# Patient Record
Sex: Female | Born: 1955 | Race: Black or African American | Hispanic: No | Marital: Married | Smoking: Never smoker
Health system: Southern US, Community
[De-identification: ages and names within clinical notes are randomized; demographics above are authoritative.]

## PROBLEM LIST (undated history)

## (undated) DIAGNOSIS — E119 Type 2 diabetes mellitus without complications: Secondary | ICD-10-CM

---

## 2001-08-17 ENCOUNTER — Other Ambulatory Visit: Admission: RE | Admit: 2001-08-17 | Discharge: 2001-08-17 | Payer: Self-pay | Admitting: Obstetrics and Gynecology

## 2002-04-23 ENCOUNTER — Encounter: Payer: Self-pay | Admitting: Emergency Medicine

## 2002-04-23 ENCOUNTER — Emergency Department (HOSPITAL_COMMUNITY): Admission: EM | Admit: 2002-04-23 | Discharge: 2002-04-23 | Payer: Self-pay | Admitting: Emergency Medicine

## 2004-08-21 ENCOUNTER — Encounter: Admission: RE | Admit: 2004-08-21 | Discharge: 2004-08-21 | Payer: Self-pay | Admitting: Specialist

## 2005-04-29 ENCOUNTER — Emergency Department (HOSPITAL_COMMUNITY): Admission: EM | Admit: 2005-04-29 | Discharge: 2005-04-29 | Payer: Self-pay | Admitting: Emergency Medicine

## 2005-05-06 ENCOUNTER — Ambulatory Visit: Payer: Self-pay | Admitting: Internal Medicine

## 2005-05-10 ENCOUNTER — Ambulatory Visit: Payer: Self-pay | Admitting: Internal Medicine

## 2005-05-10 ENCOUNTER — Other Ambulatory Visit: Admission: RE | Admit: 2005-05-10 | Discharge: 2005-05-10 | Payer: Self-pay | Admitting: Internal Medicine

## 2005-05-10 ENCOUNTER — Encounter: Payer: Self-pay | Admitting: Internal Medicine

## 2005-06-08 ENCOUNTER — Ambulatory Visit: Payer: Self-pay | Admitting: Endocrinology

## 2005-12-02 ENCOUNTER — Ambulatory Visit: Payer: Self-pay | Admitting: Endocrinology

## 2005-12-03 ENCOUNTER — Ambulatory Visit: Payer: Self-pay | Admitting: Endocrinology

## 2006-04-06 ENCOUNTER — Ambulatory Visit: Payer: Self-pay | Admitting: Endocrinology

## 2006-04-06 LAB — CONVERTED CEMR LAB
ALT: 17 units/L (ref 0–40)
AST: 15 units/L (ref 0–37)
Albumin: 3.5 g/dL (ref 3.5–5.2)
Alkaline Phosphatase: 74 units/L (ref 39–117)
Bilirubin, Direct: 0.1 mg/dL (ref 0.0–0.3)
Chol/HDL Ratio, serum: 4.3
Cholesterol: 187 mg/dL (ref 0–200)
HDL: 43 mg/dL (ref >39.0)
LDL Cholesterol: 132 mg/dL — ABNORMAL HIGH (ref 0–99)
Total Bilirubin: 0.5 mg/dL (ref 0.3–1.2)
Total Protein: 6.9 g/dL (ref 6.0–8.3)
Triglyceride fasting, serum: 58 mg/dL (ref 0–149)
VLDL: 12 mg/dL (ref 0–40)

## 2006-04-07 ENCOUNTER — Ambulatory Visit: Payer: Self-pay | Admitting: Endocrinology

## 2006-05-10 ENCOUNTER — Ambulatory Visit: Payer: Self-pay | Admitting: Endocrinology

## 2006-05-10 LAB — CONVERTED CEMR LAB
ALT: 16 units/L (ref 0–40)
AST: 14 units/L (ref 0–37)
Albumin: 3.6 g/dL (ref 3.5–5.2)
Alkaline Phosphatase: 74 units/L (ref 39–117)
Bilirubin, Direct: 0.1 mg/dL (ref 0.0–0.3)
Chol/HDL Ratio, serum: 4.1
Cholesterol: 183 mg/dL (ref 0–200)
HDL: 45 mg/dL (ref >39.0)
Hgb A1c MFr Bld: 10.1 % — ABNORMAL HIGH (ref 4.6–6.0)
LDL Cholesterol: 118 mg/dL — ABNORMAL HIGH (ref 0–99)
Total Bilirubin: 0.6 mg/dL (ref 0.3–1.2)
Total Protein: 6.7 g/dL (ref 6.0–8.3)
Triglyceride fasting, serum: 98 mg/dL (ref 0–149)
VLDL: 20 mg/dL (ref 0–40)

## 2006-05-11 ENCOUNTER — Ambulatory Visit: Payer: Self-pay | Admitting: Endocrinology

## 2006-07-13 ENCOUNTER — Ambulatory Visit: Payer: Self-pay | Admitting: Endocrinology

## 2006-07-13 LAB — CONVERTED CEMR LAB
Cholesterol: 186 mg/dL (ref 0–200)
HDL: 40.6 mg/dL (ref >39.0)
Hgb A1c MFr Bld: 11.1 % — ABNORMAL HIGH (ref 4.6–6.0)
LDL Cholesterol: 124 mg/dL — ABNORMAL HIGH (ref 0–99)
Total CHOL/HDL Ratio: 4.6
Triglycerides: 107 mg/dL (ref 0–149)
VLDL: 21 mg/dL (ref 0–40)

## 2006-08-06 ENCOUNTER — Ambulatory Visit: Payer: Self-pay | Admitting: Internal Medicine

## 2006-08-10 ENCOUNTER — Ambulatory Visit: Payer: Self-pay | Admitting: Endocrinology

## 2007-01-02 ENCOUNTER — Encounter: Payer: Self-pay | Admitting: Endocrinology

## 2007-01-02 DIAGNOSIS — E119 Type 2 diabetes mellitus without complications: Secondary | ICD-10-CM | POA: Insufficient documentation

## 2007-02-19 ENCOUNTER — Emergency Department (HOSPITAL_COMMUNITY): Admission: EM | Admit: 2007-02-19 | Discharge: 2007-02-20 | Payer: Self-pay | Admitting: Emergency Medicine

## 2008-01-28 ENCOUNTER — Emergency Department (HOSPITAL_COMMUNITY): Admission: EM | Admit: 2008-01-28 | Discharge: 2008-01-28 | Payer: Self-pay | Admitting: Emergency Medicine

## 2008-10-09 ENCOUNTER — Emergency Department (HOSPITAL_COMMUNITY): Admission: EM | Admit: 2008-10-09 | Discharge: 2008-10-09 | Payer: Self-pay | Admitting: Internal Medicine

## 2008-10-21 ENCOUNTER — Ambulatory Visit (HOSPITAL_COMMUNITY): Admission: RE | Admit: 2008-10-21 | Discharge: 2008-10-21 | Payer: Self-pay | Admitting: Diagnostic Radiology

## 2009-10-11 IMAGING — CR DG CHEST 2V
2 series · 2 of 2 positions shown · non-contrast
Comparison: 02/20/2007

CLINICAL DATA: Status post fall.  Bruising of the right lateral
chest.

CHEST - 2 VIEW

[w chest pa]
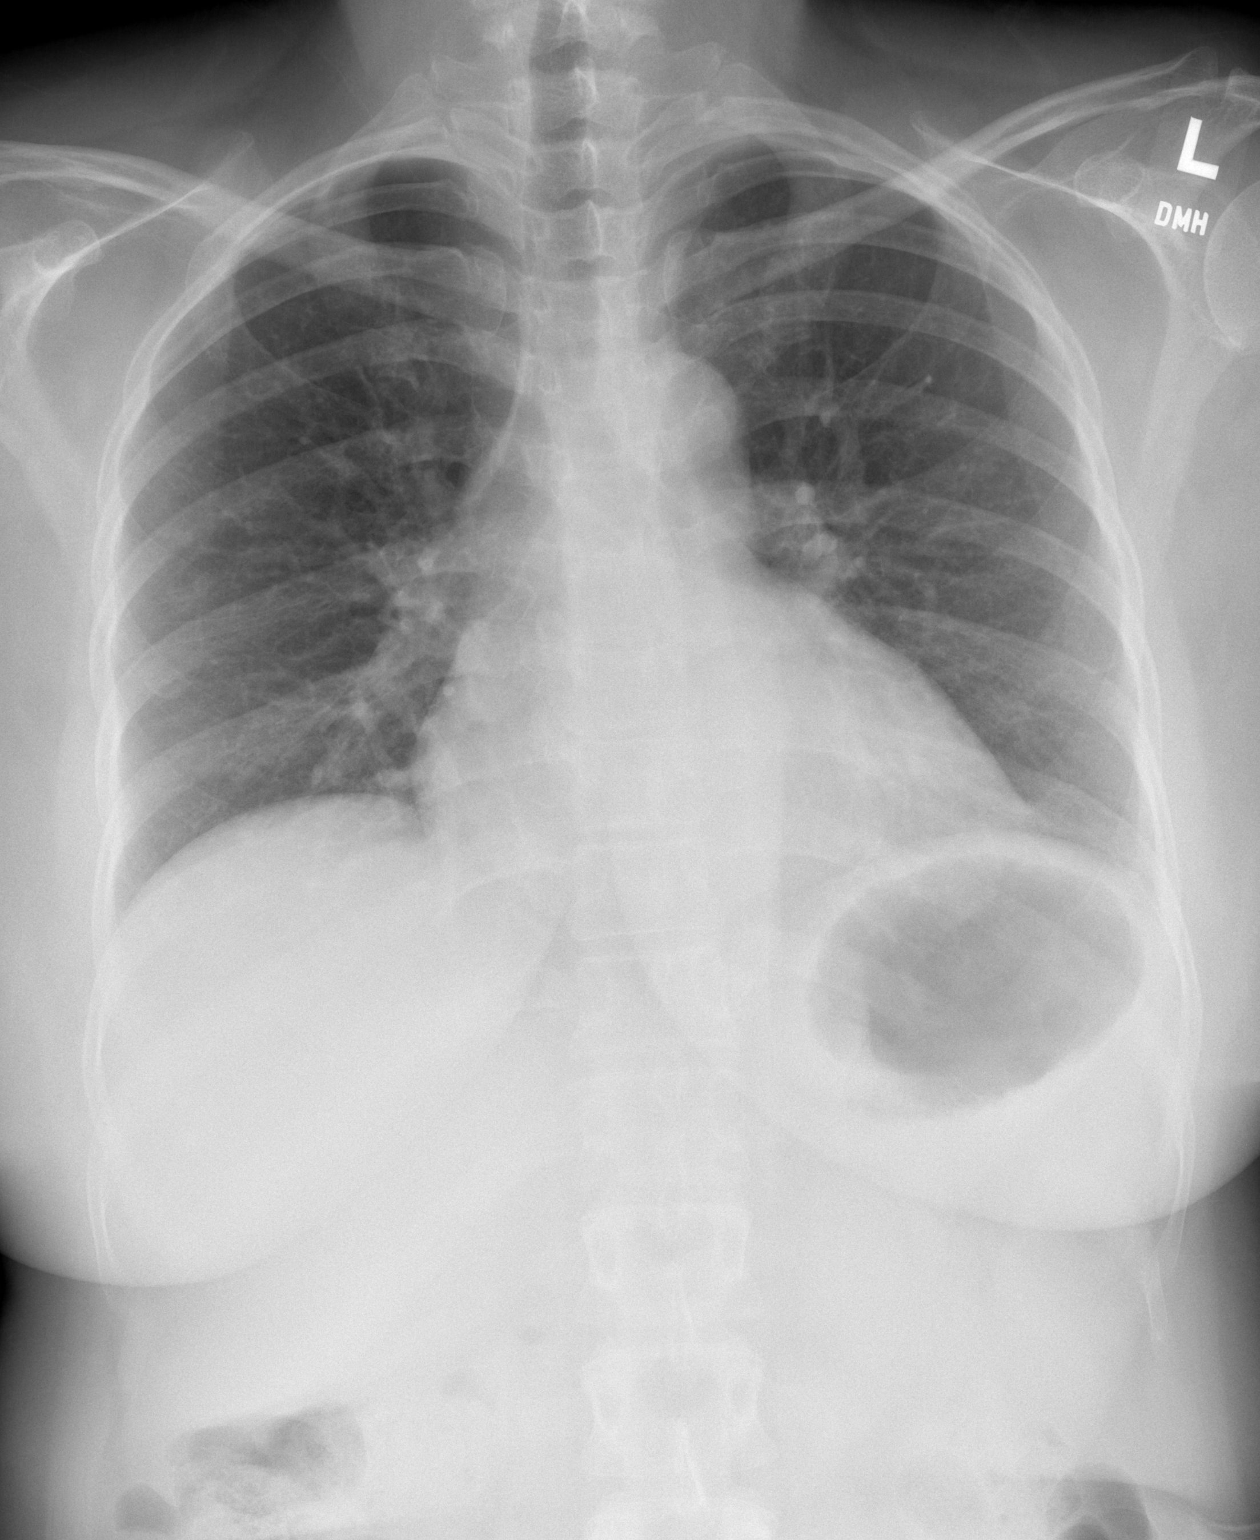

[w chest lat]
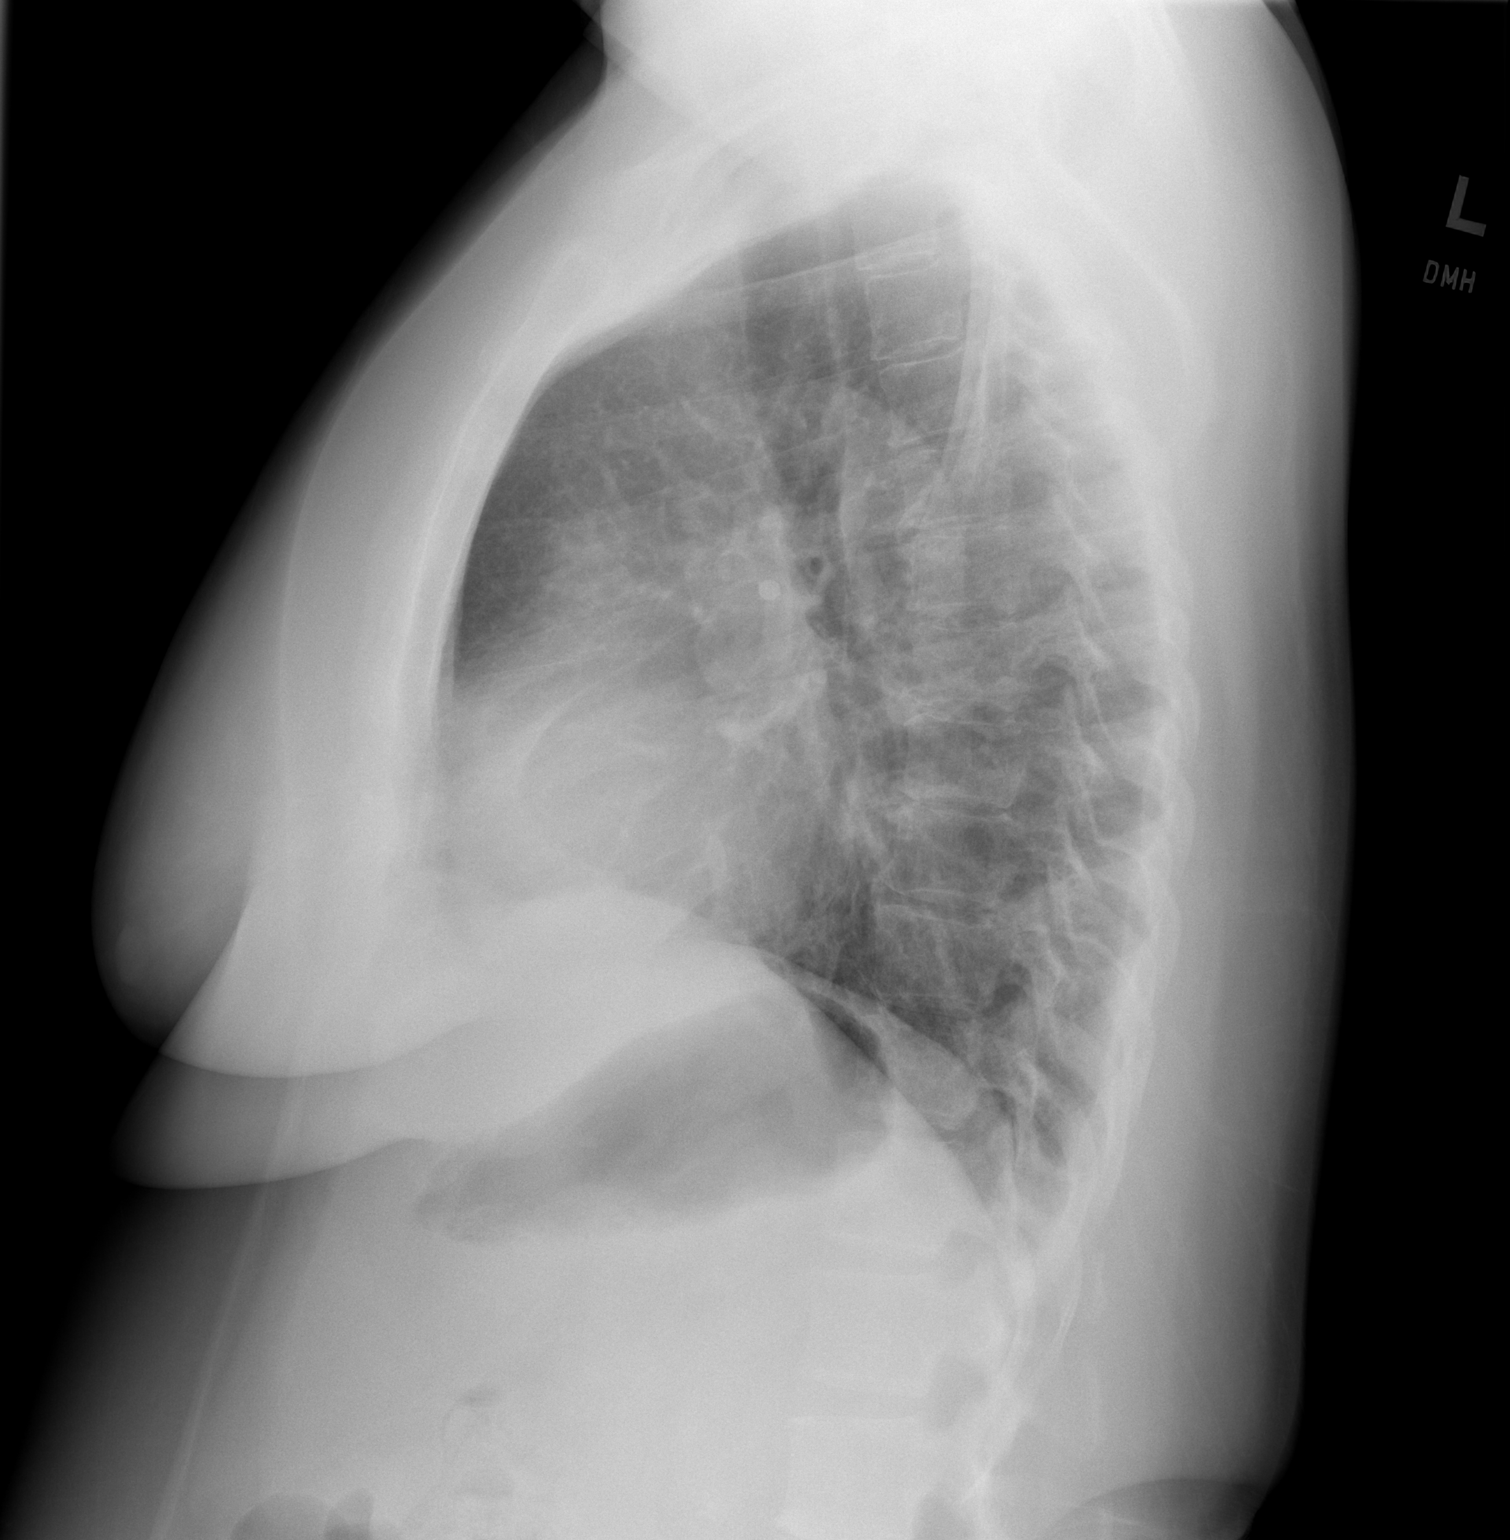

[2 of 2 positions shown; findings below may reference images not displayed]

FINDINGS: Lung volumes are low, exaggerating the heart size.  There
is minimal bibasilar atelectasis.  The lungs are otherwise clear.
The visualized soft tissues and bony thorax are unremarkable.
Minimal degenerative change is noted in the thoracic spine.
IMPRESSION: 1.  Low lung volumes exaggerating the heart size.
2.  Minimal bibasilar atelectasis.

## 2009-10-23 IMAGING — CR DG CHEST 2V
2 series · 2 of 2 positions shown · non-contrast
Comparison: 10/09/2008

CLINICAL DATA: Right chest pain after a fall.

CHEST - 2 VIEW

[view not recorded (1 of 2)]
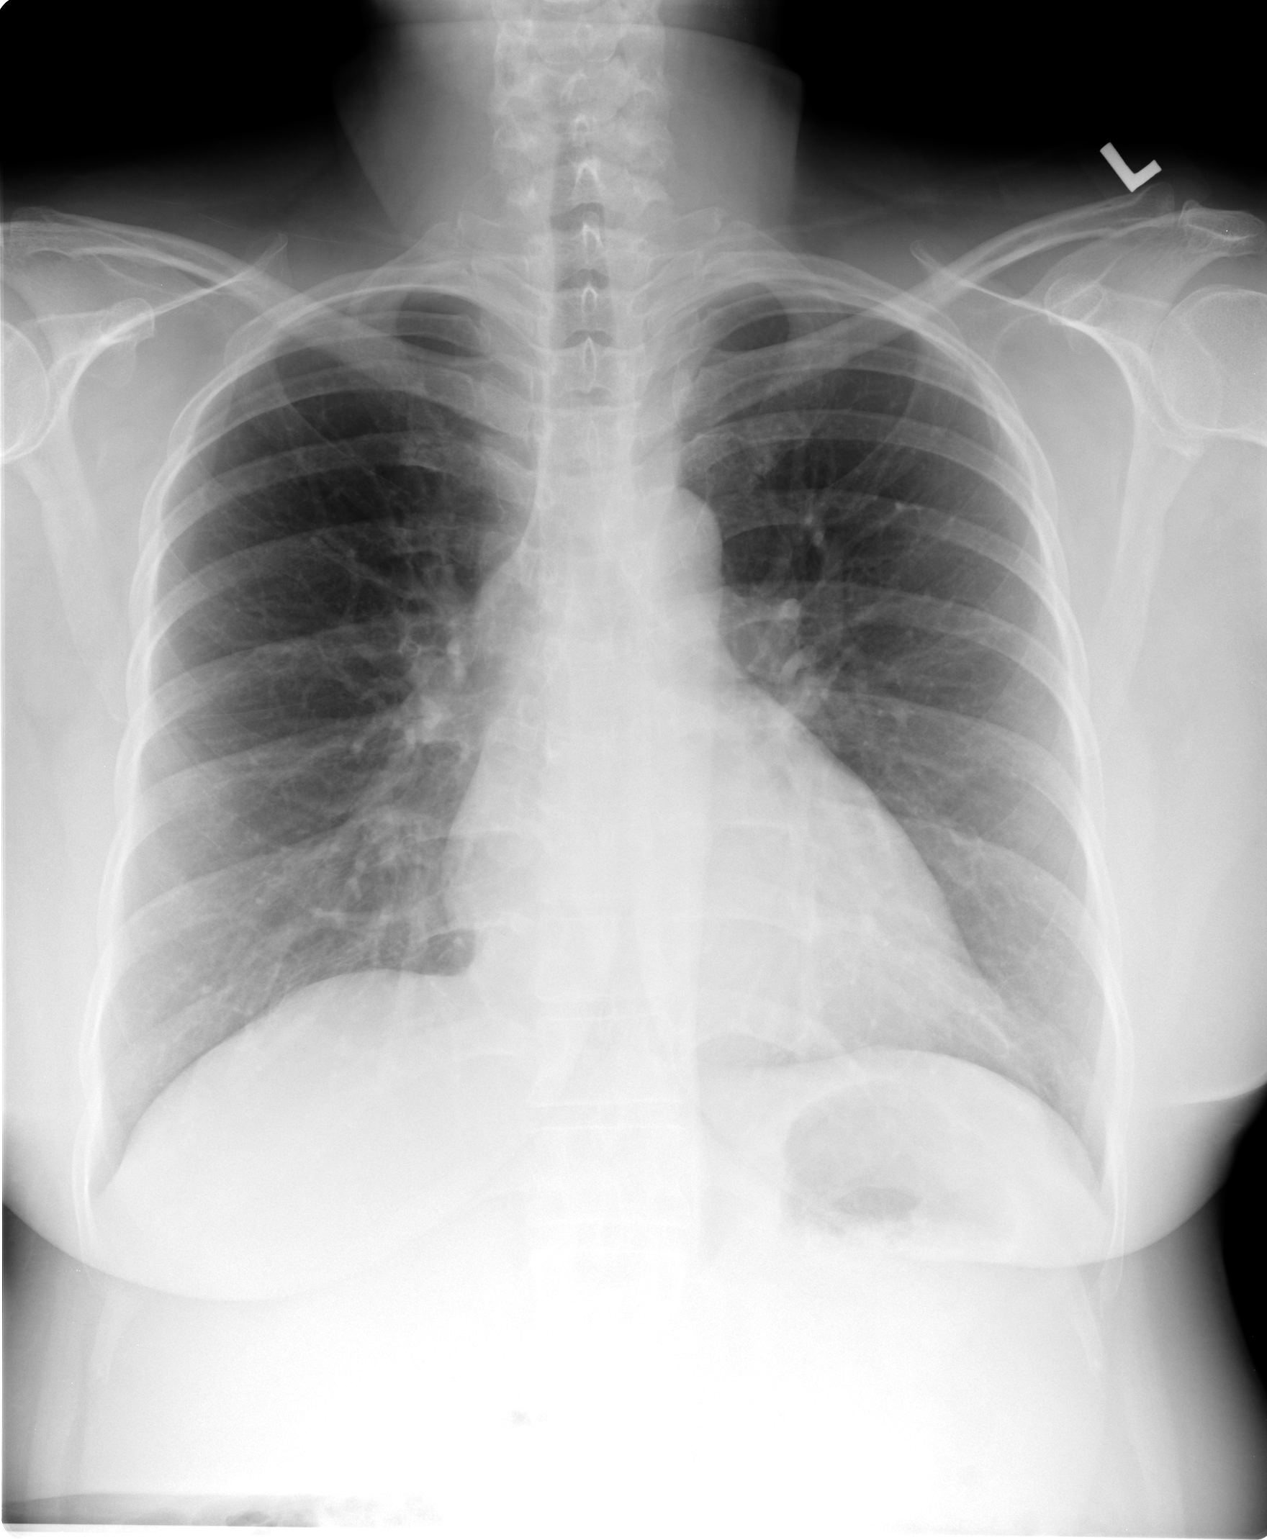

[view not recorded (2 of 2)]
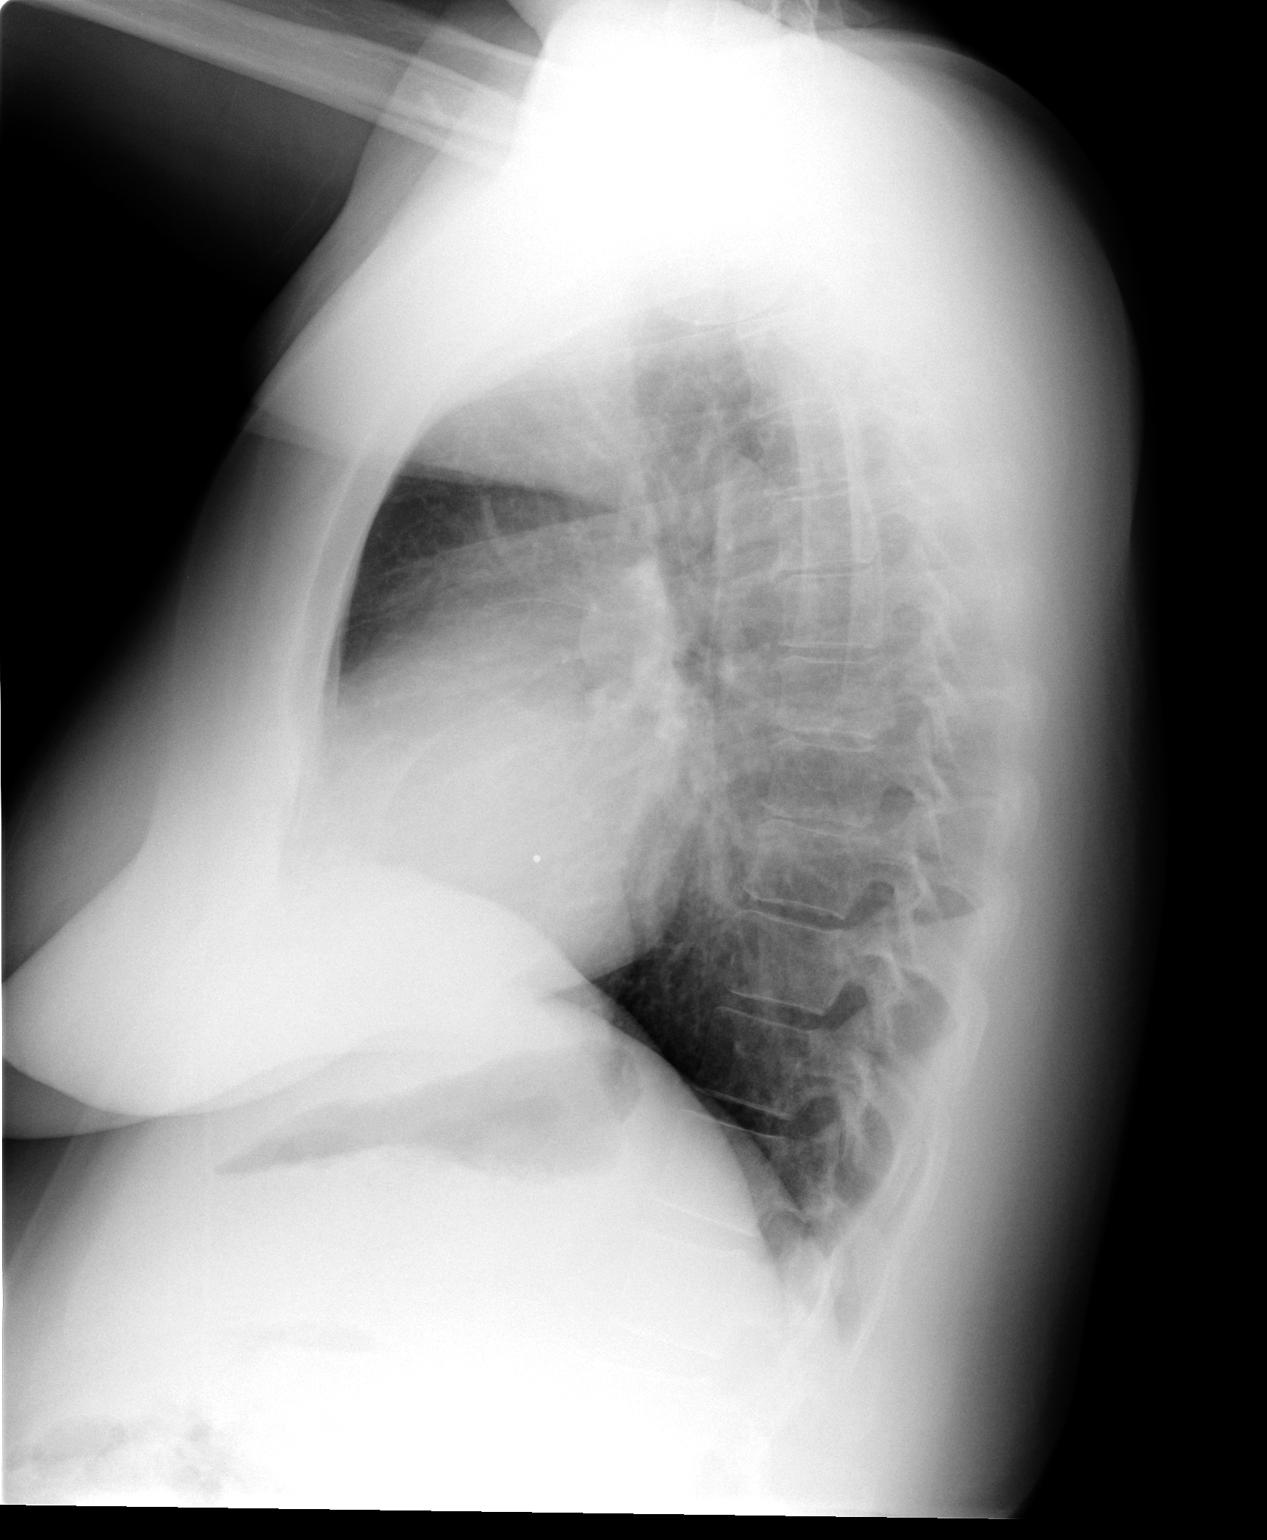

[2 of 2 positions shown; findings below may reference images not displayed]

FINDINGS: Trachea is midline.  Heart size stable.  Lungs are better
expanded on today's study and are clear.  No pleural fluid.
IMPRESSION: No acute findings.

## 2010-01-05 ENCOUNTER — Other Ambulatory Visit: Admission: RE | Admit: 2010-01-05 | Discharge: 2010-01-05 | Payer: Self-pay | Admitting: Internal Medicine

## 2011-03-18 LAB — RAPID URINE DRUG SCREEN, HOSP PERFORMED
Amphetamines: NOT DETECTED
Barbiturates: NOT DETECTED
Benzodiazepines: NOT DETECTED
Cocaine: NOT DETECTED
Opiates: NOT DETECTED
Tetrahydrocannabinol: NOT DETECTED

## 2012-09-13 ENCOUNTER — Other Ambulatory Visit: Payer: Self-pay | Admitting: Gastroenterology

## 2012-12-20 ENCOUNTER — Ambulatory Visit: Payer: Medicaid Other | Admitting: Neurology

## 2015-08-26 DIAGNOSIS — E113519 Type 2 diabetes mellitus with proliferative diabetic retinopathy with macular edema, unspecified eye: Secondary | ICD-10-CM | POA: Diagnosis not present

## 2015-08-26 DIAGNOSIS — H2512 Age-related nuclear cataract, left eye: Secondary | ICD-10-CM | POA: Diagnosis not present

## 2015-08-26 DIAGNOSIS — H4312 Vitreous hemorrhage, left eye: Secondary | ICD-10-CM | POA: Diagnosis not present

## 2015-08-26 DIAGNOSIS — Z961 Presence of intraocular lens: Secondary | ICD-10-CM | POA: Diagnosis not present

## 2015-08-26 DIAGNOSIS — H3341 Traction detachment of retina, right eye: Secondary | ICD-10-CM | POA: Diagnosis not present

## 2015-09-17 DIAGNOSIS — E119 Type 2 diabetes mellitus without complications: Secondary | ICD-10-CM | POA: Diagnosis not present

## 2015-09-17 DIAGNOSIS — E7800 Pure hypercholesterolemia, unspecified: Secondary | ICD-10-CM | POA: Diagnosis not present

## 2015-09-17 DIAGNOSIS — E559 Vitamin D deficiency, unspecified: Secondary | ICD-10-CM | POA: Diagnosis not present

## 2015-09-17 DIAGNOSIS — I10 Essential (primary) hypertension: Secondary | ICD-10-CM | POA: Diagnosis not present

## 2015-09-23 DIAGNOSIS — H2512 Age-related nuclear cataract, left eye: Secondary | ICD-10-CM | POA: Diagnosis not present

## 2015-09-23 DIAGNOSIS — E113513 Type 2 diabetes mellitus with proliferative diabetic retinopathy with macular edema, bilateral: Secondary | ICD-10-CM | POA: Diagnosis not present

## 2015-09-23 DIAGNOSIS — H3341 Traction detachment of retina, right eye: Secondary | ICD-10-CM | POA: Diagnosis not present

## 2015-09-23 DIAGNOSIS — Z961 Presence of intraocular lens: Secondary | ICD-10-CM | POA: Diagnosis not present

## 2015-12-23 DIAGNOSIS — E559 Vitamin D deficiency, unspecified: Secondary | ICD-10-CM | POA: Diagnosis not present

## 2015-12-23 DIAGNOSIS — I10 Essential (primary) hypertension: Secondary | ICD-10-CM | POA: Diagnosis not present

## 2015-12-23 DIAGNOSIS — E7800 Pure hypercholesterolemia, unspecified: Secondary | ICD-10-CM | POA: Diagnosis not present

## 2015-12-23 DIAGNOSIS — G510 Bell's palsy: Secondary | ICD-10-CM | POA: Diagnosis not present

## 2015-12-23 DIAGNOSIS — E119 Type 2 diabetes mellitus without complications: Secondary | ICD-10-CM | POA: Diagnosis not present

## 2015-12-25 DIAGNOSIS — Z124 Encounter for screening for malignant neoplasm of cervix: Secondary | ICD-10-CM | POA: Diagnosis not present

## 2015-12-25 DIAGNOSIS — Z6829 Body mass index (BMI) 29.0-29.9, adult: Secondary | ICD-10-CM | POA: Diagnosis not present

## 2015-12-25 DIAGNOSIS — Z1231 Encounter for screening mammogram for malignant neoplasm of breast: Secondary | ICD-10-CM | POA: Diagnosis not present

## 2015-12-25 DIAGNOSIS — B373 Candidiasis of vulva and vagina: Secondary | ICD-10-CM | POA: Diagnosis not present

## 2015-12-30 DIAGNOSIS — H3341 Traction detachment of retina, right eye: Secondary | ICD-10-CM | POA: Diagnosis not present

## 2015-12-30 DIAGNOSIS — H2512 Age-related nuclear cataract, left eye: Secondary | ICD-10-CM | POA: Diagnosis not present

## 2015-12-30 DIAGNOSIS — E113513 Type 2 diabetes mellitus with proliferative diabetic retinopathy with macular edema, bilateral: Secondary | ICD-10-CM | POA: Diagnosis not present

## 2015-12-30 DIAGNOSIS — Z961 Presence of intraocular lens: Secondary | ICD-10-CM | POA: Diagnosis not present

## 2016-04-27 DIAGNOSIS — E113513 Type 2 diabetes mellitus with proliferative diabetic retinopathy with macular edema, bilateral: Secondary | ICD-10-CM | POA: Diagnosis not present

## 2016-04-27 DIAGNOSIS — H3341 Traction detachment of retina, right eye: Secondary | ICD-10-CM | POA: Diagnosis not present

## 2016-04-27 DIAGNOSIS — Z961 Presence of intraocular lens: Secondary | ICD-10-CM | POA: Diagnosis not present

## 2016-04-27 DIAGNOSIS — H35033 Hypertensive retinopathy, bilateral: Secondary | ICD-10-CM | POA: Diagnosis not present

## 2016-04-27 DIAGNOSIS — H4312 Vitreous hemorrhage, left eye: Secondary | ICD-10-CM | POA: Diagnosis not present

## 2016-05-20 DIAGNOSIS — M7502 Adhesive capsulitis of left shoulder: Secondary | ICD-10-CM | POA: Diagnosis not present

## 2016-05-28 DIAGNOSIS — M7502 Adhesive capsulitis of left shoulder: Secondary | ICD-10-CM | POA: Diagnosis not present

## 2016-06-02 DIAGNOSIS — M7502 Adhesive capsulitis of left shoulder: Secondary | ICD-10-CM | POA: Diagnosis not present

## 2016-06-04 DIAGNOSIS — M7502 Adhesive capsulitis of left shoulder: Secondary | ICD-10-CM | POA: Diagnosis not present

## 2016-06-08 DIAGNOSIS — M7502 Adhesive capsulitis of left shoulder: Secondary | ICD-10-CM | POA: Diagnosis not present

## 2016-06-11 DIAGNOSIS — M7502 Adhesive capsulitis of left shoulder: Secondary | ICD-10-CM | POA: Diagnosis not present

## 2016-06-15 DIAGNOSIS — H2512 Age-related nuclear cataract, left eye: Secondary | ICD-10-CM | POA: Diagnosis not present

## 2016-06-15 DIAGNOSIS — H3341 Traction detachment of retina, right eye: Secondary | ICD-10-CM | POA: Diagnosis not present

## 2016-06-15 DIAGNOSIS — H4312 Vitreous hemorrhage, left eye: Secondary | ICD-10-CM | POA: Diagnosis not present

## 2016-06-15 DIAGNOSIS — Z961 Presence of intraocular lens: Secondary | ICD-10-CM | POA: Diagnosis not present

## 2016-06-15 DIAGNOSIS — E113513 Type 2 diabetes mellitus with proliferative diabetic retinopathy with macular edema, bilateral: Secondary | ICD-10-CM | POA: Diagnosis not present

## 2016-06-15 DIAGNOSIS — M7502 Adhesive capsulitis of left shoulder: Secondary | ICD-10-CM | POA: Diagnosis not present

## 2016-06-17 DIAGNOSIS — M7502 Adhesive capsulitis of left shoulder: Secondary | ICD-10-CM | POA: Diagnosis not present

## 2016-06-25 DIAGNOSIS — M7502 Adhesive capsulitis of left shoulder: Secondary | ICD-10-CM | POA: Diagnosis not present

## 2016-06-28 DIAGNOSIS — M7502 Adhesive capsulitis of left shoulder: Secondary | ICD-10-CM | POA: Diagnosis not present

## 2016-06-30 DIAGNOSIS — M7502 Adhesive capsulitis of left shoulder: Secondary | ICD-10-CM | POA: Diagnosis not present

## 2016-07-01 DIAGNOSIS — M7502 Adhesive capsulitis of left shoulder: Secondary | ICD-10-CM | POA: Diagnosis not present

## 2016-07-05 DIAGNOSIS — I10 Essential (primary) hypertension: Secondary | ICD-10-CM | POA: Diagnosis not present

## 2016-07-05 DIAGNOSIS — E781 Pure hyperglyceridemia: Secondary | ICD-10-CM | POA: Diagnosis not present

## 2016-07-05 DIAGNOSIS — E559 Vitamin D deficiency, unspecified: Secondary | ICD-10-CM | POA: Diagnosis not present

## 2016-07-05 DIAGNOSIS — E7800 Pure hypercholesterolemia, unspecified: Secondary | ICD-10-CM | POA: Diagnosis not present

## 2016-07-05 DIAGNOSIS — E119 Type 2 diabetes mellitus without complications: Secondary | ICD-10-CM | POA: Diagnosis not present

## 2016-07-05 DIAGNOSIS — G510 Bell's palsy: Secondary | ICD-10-CM | POA: Diagnosis not present

## 2016-07-06 DIAGNOSIS — M7502 Adhesive capsulitis of left shoulder: Secondary | ICD-10-CM | POA: Diagnosis not present

## 2016-07-08 DIAGNOSIS — M7502 Adhesive capsulitis of left shoulder: Secondary | ICD-10-CM | POA: Diagnosis not present

## 2016-07-13 DIAGNOSIS — M7502 Adhesive capsulitis of left shoulder: Secondary | ICD-10-CM | POA: Diagnosis not present

## 2016-07-14 DIAGNOSIS — Z86010 Personal history of colonic polyps: Secondary | ICD-10-CM | POA: Diagnosis not present

## 2016-07-14 DIAGNOSIS — K635 Polyp of colon: Secondary | ICD-10-CM | POA: Diagnosis not present

## 2016-07-15 DIAGNOSIS — M7502 Adhesive capsulitis of left shoulder: Secondary | ICD-10-CM | POA: Diagnosis not present

## 2016-07-20 DIAGNOSIS — M7502 Adhesive capsulitis of left shoulder: Secondary | ICD-10-CM | POA: Diagnosis not present

## 2016-07-20 DIAGNOSIS — K635 Polyp of colon: Secondary | ICD-10-CM | POA: Diagnosis not present

## 2016-07-21 DIAGNOSIS — M7502 Adhesive capsulitis of left shoulder: Secondary | ICD-10-CM | POA: Diagnosis not present

## 2016-07-22 DIAGNOSIS — M7502 Adhesive capsulitis of left shoulder: Secondary | ICD-10-CM | POA: Diagnosis not present

## 2016-07-27 DIAGNOSIS — M7502 Adhesive capsulitis of left shoulder: Secondary | ICD-10-CM | POA: Diagnosis not present

## 2016-07-28 DIAGNOSIS — H2512 Age-related nuclear cataract, left eye: Secondary | ICD-10-CM | POA: Diagnosis not present

## 2016-07-28 DIAGNOSIS — H25012 Cortical age-related cataract, left eye: Secondary | ICD-10-CM | POA: Diagnosis not present

## 2016-07-29 DIAGNOSIS — M7502 Adhesive capsulitis of left shoulder: Secondary | ICD-10-CM | POA: Diagnosis not present

## 2016-08-03 DIAGNOSIS — M7502 Adhesive capsulitis of left shoulder: Secondary | ICD-10-CM | POA: Diagnosis not present

## 2016-08-05 DIAGNOSIS — M7502 Adhesive capsulitis of left shoulder: Secondary | ICD-10-CM | POA: Diagnosis not present

## 2016-08-10 DIAGNOSIS — M7502 Adhesive capsulitis of left shoulder: Secondary | ICD-10-CM | POA: Diagnosis not present

## 2016-08-12 DIAGNOSIS — M7502 Adhesive capsulitis of left shoulder: Secondary | ICD-10-CM | POA: Diagnosis not present

## 2016-09-07 DIAGNOSIS — M7502 Adhesive capsulitis of left shoulder: Secondary | ICD-10-CM | POA: Diagnosis not present

## 2016-09-08 DIAGNOSIS — M7502 Adhesive capsulitis of left shoulder: Secondary | ICD-10-CM | POA: Diagnosis not present

## 2016-09-10 DIAGNOSIS — Z961 Presence of intraocular lens: Secondary | ICD-10-CM | POA: Diagnosis not present

## 2016-09-10 DIAGNOSIS — H2512 Age-related nuclear cataract, left eye: Secondary | ICD-10-CM | POA: Diagnosis not present

## 2016-09-15 DIAGNOSIS — M7502 Adhesive capsulitis of left shoulder: Secondary | ICD-10-CM | POA: Diagnosis not present

## 2016-09-16 DIAGNOSIS — Z833 Family history of diabetes mellitus: Secondary | ICD-10-CM | POA: Diagnosis not present

## 2016-09-16 DIAGNOSIS — H25812 Combined forms of age-related cataract, left eye: Secondary | ICD-10-CM | POA: Diagnosis not present

## 2016-09-16 DIAGNOSIS — Z888 Allergy status to other drugs, medicaments and biological substances status: Secondary | ICD-10-CM | POA: Diagnosis not present

## 2016-09-16 DIAGNOSIS — E1136 Type 2 diabetes mellitus with diabetic cataract: Secondary | ICD-10-CM | POA: Diagnosis not present

## 2016-09-16 DIAGNOSIS — Z9841 Cataract extraction status, right eye: Secondary | ICD-10-CM | POA: Diagnosis not present

## 2016-09-16 DIAGNOSIS — Z794 Long term (current) use of insulin: Secondary | ICD-10-CM | POA: Diagnosis not present

## 2016-09-16 DIAGNOSIS — Z791 Long term (current) use of non-steroidal anti-inflammatories (NSAID): Secondary | ICD-10-CM | POA: Diagnosis not present

## 2016-09-16 DIAGNOSIS — Z961 Presence of intraocular lens: Secondary | ICD-10-CM | POA: Diagnosis not present

## 2016-09-16 DIAGNOSIS — Z79899 Other long term (current) drug therapy: Secondary | ICD-10-CM | POA: Diagnosis not present

## 2016-09-28 DIAGNOSIS — M7502 Adhesive capsulitis of left shoulder: Secondary | ICD-10-CM | POA: Diagnosis not present

## 2016-09-29 DIAGNOSIS — M7502 Adhesive capsulitis of left shoulder: Secondary | ICD-10-CM | POA: Diagnosis not present

## 2016-10-01 DIAGNOSIS — M7502 Adhesive capsulitis of left shoulder: Secondary | ICD-10-CM | POA: Diagnosis not present

## 2016-10-21 DIAGNOSIS — M2550 Pain in unspecified joint: Secondary | ICD-10-CM | POA: Diagnosis not present

## 2016-10-21 DIAGNOSIS — E119 Type 2 diabetes mellitus without complications: Secondary | ICD-10-CM | POA: Diagnosis not present

## 2016-10-21 DIAGNOSIS — I10 Essential (primary) hypertension: Secondary | ICD-10-CM | POA: Diagnosis not present

## 2016-10-21 DIAGNOSIS — E559 Vitamin D deficiency, unspecified: Secondary | ICD-10-CM | POA: Diagnosis not present

## 2016-10-21 DIAGNOSIS — E1165 Type 2 diabetes mellitus with hyperglycemia: Secondary | ICD-10-CM | POA: Diagnosis not present

## 2016-10-28 DIAGNOSIS — E119 Type 2 diabetes mellitus without complications: Secondary | ICD-10-CM | POA: Diagnosis not present

## 2016-10-28 DIAGNOSIS — I10 Essential (primary) hypertension: Secondary | ICD-10-CM | POA: Diagnosis not present

## 2016-10-28 DIAGNOSIS — G510 Bell's palsy: Secondary | ICD-10-CM | POA: Diagnosis not present

## 2016-10-28 DIAGNOSIS — E7800 Pure hypercholesterolemia, unspecified: Secondary | ICD-10-CM | POA: Diagnosis not present

## 2016-11-04 DIAGNOSIS — E1165 Type 2 diabetes mellitus with hyperglycemia: Secondary | ICD-10-CM | POA: Diagnosis not present

## 2016-11-23 DIAGNOSIS — H4312 Vitreous hemorrhage, left eye: Secondary | ICD-10-CM | POA: Diagnosis not present

## 2016-11-23 DIAGNOSIS — H3341 Traction detachment of retina, right eye: Secondary | ICD-10-CM | POA: Diagnosis not present

## 2016-11-23 DIAGNOSIS — Z961 Presence of intraocular lens: Secondary | ICD-10-CM | POA: Diagnosis not present

## 2016-11-23 DIAGNOSIS — H35033 Hypertensive retinopathy, bilateral: Secondary | ICD-10-CM | POA: Diagnosis not present

## 2016-11-23 DIAGNOSIS — E113513 Type 2 diabetes mellitus with proliferative diabetic retinopathy with macular edema, bilateral: Secondary | ICD-10-CM | POA: Diagnosis not present

## 2016-12-30 DIAGNOSIS — Z01419 Encounter for gynecological examination (general) (routine) without abnormal findings: Secondary | ICD-10-CM | POA: Diagnosis not present

## 2016-12-30 DIAGNOSIS — Z1231 Encounter for screening mammogram for malignant neoplasm of breast: Secondary | ICD-10-CM | POA: Diagnosis not present

## 2016-12-30 DIAGNOSIS — Z6830 Body mass index (BMI) 30.0-30.9, adult: Secondary | ICD-10-CM | POA: Diagnosis not present

## 2016-12-30 DIAGNOSIS — B373 Candidiasis of vulva and vagina: Secondary | ICD-10-CM | POA: Diagnosis not present

## 2017-01-12 DIAGNOSIS — M8588 Other specified disorders of bone density and structure, other site: Secondary | ICD-10-CM | POA: Diagnosis not present

## 2017-01-12 DIAGNOSIS — N958 Other specified menopausal and perimenopausal disorders: Secondary | ICD-10-CM | POA: Diagnosis not present

## 2017-01-28 DIAGNOSIS — H26492 Other secondary cataract, left eye: Secondary | ICD-10-CM | POA: Diagnosis not present

## 2017-02-01 DIAGNOSIS — E7800 Pure hypercholesterolemia, unspecified: Secondary | ICD-10-CM | POA: Diagnosis not present

## 2017-02-01 DIAGNOSIS — I10 Essential (primary) hypertension: Secondary | ICD-10-CM | POA: Diagnosis not present

## 2017-02-01 DIAGNOSIS — G510 Bell's palsy: Secondary | ICD-10-CM | POA: Diagnosis not present

## 2017-02-01 DIAGNOSIS — E1165 Type 2 diabetes mellitus with hyperglycemia: Secondary | ICD-10-CM | POA: Diagnosis not present

## 2017-02-10 DIAGNOSIS — Z794 Long term (current) use of insulin: Secondary | ICD-10-CM | POA: Diagnosis not present

## 2017-02-10 DIAGNOSIS — E113593 Type 2 diabetes mellitus with proliferative diabetic retinopathy without macular edema, bilateral: Secondary | ICD-10-CM | POA: Diagnosis not present

## 2017-02-10 DIAGNOSIS — R739 Hyperglycemia, unspecified: Secondary | ICD-10-CM | POA: Diagnosis not present

## 2017-02-10 DIAGNOSIS — Z23 Encounter for immunization: Secondary | ICD-10-CM | POA: Diagnosis not present

## 2017-05-26 DIAGNOSIS — G510 Bell's palsy: Secondary | ICD-10-CM | POA: Diagnosis not present

## 2017-05-26 DIAGNOSIS — E7800 Pure hypercholesterolemia, unspecified: Secondary | ICD-10-CM | POA: Diagnosis not present

## 2017-05-26 DIAGNOSIS — I10 Essential (primary) hypertension: Secondary | ICD-10-CM | POA: Diagnosis not present

## 2017-05-26 DIAGNOSIS — E1165 Type 2 diabetes mellitus with hyperglycemia: Secondary | ICD-10-CM | POA: Diagnosis not present

## 2017-06-22 DIAGNOSIS — E119 Type 2 diabetes mellitus without complications: Secondary | ICD-10-CM | POA: Diagnosis not present

## 2017-06-22 DIAGNOSIS — E782 Mixed hyperlipidemia: Secondary | ICD-10-CM | POA: Diagnosis not present

## 2017-07-13 DIAGNOSIS — E119 Type 2 diabetes mellitus without complications: Secondary | ICD-10-CM | POA: Diagnosis not present

## 2017-07-13 DIAGNOSIS — E782 Mixed hyperlipidemia: Secondary | ICD-10-CM | POA: Diagnosis not present

## 2017-07-22 DIAGNOSIS — Z961 Presence of intraocular lens: Secondary | ICD-10-CM | POA: Diagnosis not present

## 2017-07-22 DIAGNOSIS — H3341 Traction detachment of retina, right eye: Secondary | ICD-10-CM | POA: Diagnosis not present

## 2017-07-22 DIAGNOSIS — E113513 Type 2 diabetes mellitus with proliferative diabetic retinopathy with macular edema, bilateral: Secondary | ICD-10-CM | POA: Diagnosis not present

## 2017-07-22 DIAGNOSIS — H4312 Vitreous hemorrhage, left eye: Secondary | ICD-10-CM | POA: Diagnosis not present

## 2017-07-22 DIAGNOSIS — H35033 Hypertensive retinopathy, bilateral: Secondary | ICD-10-CM | POA: Diagnosis not present

## 2017-07-27 DIAGNOSIS — Z794 Long term (current) use of insulin: Secondary | ICD-10-CM | POA: Diagnosis not present

## 2017-07-27 DIAGNOSIS — E1165 Type 2 diabetes mellitus with hyperglycemia: Secondary | ICD-10-CM | POA: Diagnosis not present

## 2017-07-27 DIAGNOSIS — Z6832 Body mass index (BMI) 32.0-32.9, adult: Secondary | ICD-10-CM | POA: Diagnosis not present

## 2017-07-27 DIAGNOSIS — E6609 Other obesity due to excess calories: Secondary | ICD-10-CM | POA: Diagnosis not present

## 2017-07-27 DIAGNOSIS — M19012 Primary osteoarthritis, left shoulder: Secondary | ICD-10-CM | POA: Diagnosis not present

## 2017-07-27 DIAGNOSIS — E119 Type 2 diabetes mellitus without complications: Secondary | ICD-10-CM | POA: Diagnosis not present

## 2017-07-27 DIAGNOSIS — Z23 Encounter for immunization: Secondary | ICD-10-CM | POA: Diagnosis not present

## 2017-07-27 DIAGNOSIS — E782 Mixed hyperlipidemia: Secondary | ICD-10-CM | POA: Diagnosis not present

## 2017-07-28 DIAGNOSIS — M19012 Primary osteoarthritis, left shoulder: Secondary | ICD-10-CM | POA: Diagnosis not present

## 2017-07-28 DIAGNOSIS — E119 Type 2 diabetes mellitus without complications: Secondary | ICD-10-CM | POA: Diagnosis not present

## 2017-07-28 DIAGNOSIS — E782 Mixed hyperlipidemia: Secondary | ICD-10-CM | POA: Diagnosis not present

## 2017-07-28 DIAGNOSIS — Z0000 Encounter for general adult medical examination without abnormal findings: Secondary | ICD-10-CM | POA: Diagnosis not present

## 2017-07-28 DIAGNOSIS — E1165 Type 2 diabetes mellitus with hyperglycemia: Secondary | ICD-10-CM | POA: Diagnosis not present

## 2017-08-03 DIAGNOSIS — Z794 Long term (current) use of insulin: Secondary | ICD-10-CM | POA: Diagnosis not present

## 2017-08-03 DIAGNOSIS — M19012 Primary osteoarthritis, left shoulder: Secondary | ICD-10-CM | POA: Diagnosis not present

## 2017-08-03 DIAGNOSIS — E6609 Other obesity due to excess calories: Secondary | ICD-10-CM | POA: Diagnosis not present

## 2017-08-03 DIAGNOSIS — E1165 Type 2 diabetes mellitus with hyperglycemia: Secondary | ICD-10-CM | POA: Diagnosis not present

## 2017-08-03 DIAGNOSIS — E782 Mixed hyperlipidemia: Secondary | ICD-10-CM | POA: Diagnosis not present

## 2017-08-03 DIAGNOSIS — E119 Type 2 diabetes mellitus without complications: Secondary | ICD-10-CM | POA: Diagnosis not present

## 2017-08-31 DIAGNOSIS — E782 Mixed hyperlipidemia: Secondary | ICD-10-CM | POA: Diagnosis not present

## 2017-08-31 DIAGNOSIS — E119 Type 2 diabetes mellitus without complications: Secondary | ICD-10-CM | POA: Diagnosis not present

## 2017-12-30 DIAGNOSIS — M25521 Pain in right elbow: Secondary | ICD-10-CM | POA: Diagnosis not present

## 2018-01-04 DIAGNOSIS — M7711 Lateral epicondylitis, right elbow: Secondary | ICD-10-CM | POA: Diagnosis not present

## 2018-01-20 DIAGNOSIS — Z794 Long term (current) use of insulin: Secondary | ICD-10-CM | POA: Diagnosis not present

## 2018-01-20 DIAGNOSIS — Z961 Presence of intraocular lens: Secondary | ICD-10-CM | POA: Diagnosis not present

## 2018-01-20 DIAGNOSIS — H3341 Traction detachment of retina, right eye: Secondary | ICD-10-CM | POA: Diagnosis not present

## 2018-01-20 DIAGNOSIS — E113513 Type 2 diabetes mellitus with proliferative diabetic retinopathy with macular edema, bilateral: Secondary | ICD-10-CM | POA: Diagnosis not present

## 2018-01-20 DIAGNOSIS — H4312 Vitreous hemorrhage, left eye: Secondary | ICD-10-CM | POA: Diagnosis not present

## 2018-01-20 DIAGNOSIS — H35033 Hypertensive retinopathy, bilateral: Secondary | ICD-10-CM | POA: Diagnosis not present

## 2018-04-12 DIAGNOSIS — E119 Type 2 diabetes mellitus without complications: Secondary | ICD-10-CM | POA: Diagnosis not present

## 2018-04-12 DIAGNOSIS — Z6830 Body mass index (BMI) 30.0-30.9, adult: Secondary | ICD-10-CM | POA: Diagnosis not present

## 2018-04-12 DIAGNOSIS — E782 Mixed hyperlipidemia: Secondary | ICD-10-CM | POA: Diagnosis not present

## 2018-04-26 DIAGNOSIS — Z6830 Body mass index (BMI) 30.0-30.9, adult: Secondary | ICD-10-CM | POA: Diagnosis not present

## 2018-04-26 DIAGNOSIS — E782 Mixed hyperlipidemia: Secondary | ICD-10-CM | POA: Diagnosis not present

## 2018-04-26 DIAGNOSIS — E119 Type 2 diabetes mellitus without complications: Secondary | ICD-10-CM | POA: Diagnosis not present

## 2019-05-10 DIAGNOSIS — E119 Type 2 diabetes mellitus without complications: Secondary | ICD-10-CM | POA: Diagnosis not present

## 2019-05-10 DIAGNOSIS — I10 Essential (primary) hypertension: Secondary | ICD-10-CM | POA: Diagnosis not present

## 2019-05-10 DIAGNOSIS — Z6830 Body mass index (BMI) 30.0-30.9, adult: Secondary | ICD-10-CM | POA: Diagnosis not present

## 2019-05-10 DIAGNOSIS — E782 Mixed hyperlipidemia: Secondary | ICD-10-CM | POA: Diagnosis not present

## 2019-09-08 ENCOUNTER — Ambulatory Visit: Payer: Self-pay | Attending: Internal Medicine

## 2019-09-08 DIAGNOSIS — Z23 Encounter for immunization: Secondary | ICD-10-CM

## 2019-09-08 NOTE — Progress Notes (Signed)
   Covid-19 Vaccination Clinic  Name:  Courtney Ingram    MRN: [id number] DOB: 19-Dec-1955  09/08/2019  Ms. [name] was observed post Covid-19 immunization for 15 minutes without incident. She was provided with Vaccine Information Sheet and instruction to access the V-Safe system.   Ms. [name] was instructed to call 911 with any severe reactions post vaccine: Marland Kitchen Difficulty breathing  . Swelling of face and throat  . A fast heartbeat  . A bad rash all over body  . Dizziness and weakness   Immunizations Administered    Name Date Dose VIS Date Route   Pfizer COVID-19 Vaccine 09/08/2019 11:01 AM 0.3 mL 05/18/2019 Intramuscular   Manufacturer: [employer], [employer]   Lot: [id number]   NDC: [address]-6

## 2019-09-10 DIAGNOSIS — Z6830 Body mass index (BMI) 30.0-30.9, adult: Secondary | ICD-10-CM | POA: Diagnosis not present

## 2019-09-10 DIAGNOSIS — E119 Type 2 diabetes mellitus without complications: Secondary | ICD-10-CM | POA: Diagnosis not present

## 2019-09-10 DIAGNOSIS — I10 Essential (primary) hypertension: Secondary | ICD-10-CM | POA: Diagnosis not present

## 2019-09-10 DIAGNOSIS — E782 Mixed hyperlipidemia: Secondary | ICD-10-CM | POA: Diagnosis not present

## 2019-09-18 DIAGNOSIS — Z9842 Cataract extraction status, left eye: Secondary | ICD-10-CM | POA: Diagnosis not present

## 2019-09-18 DIAGNOSIS — H4053X0 Glaucoma secondary to other eye disorders, bilateral, stage unspecified: Secondary | ICD-10-CM | POA: Diagnosis not present

## 2019-09-18 DIAGNOSIS — E113513 Type 2 diabetes mellitus with proliferative diabetic retinopathy with macular edema, bilateral: Secondary | ICD-10-CM | POA: Diagnosis not present

## 2019-09-18 DIAGNOSIS — Z961 Presence of intraocular lens: Secondary | ICD-10-CM | POA: Diagnosis not present

## 2019-09-18 DIAGNOSIS — H3341 Traction detachment of retina, right eye: Secondary | ICD-10-CM | POA: Diagnosis not present

## 2019-09-18 DIAGNOSIS — H35033 Hypertensive retinopathy, bilateral: Secondary | ICD-10-CM | POA: Diagnosis not present

## 2019-09-18 DIAGNOSIS — H4312 Vitreous hemorrhage, left eye: Secondary | ICD-10-CM | POA: Diagnosis not present

## 2019-09-18 DIAGNOSIS — Z9841 Cataract extraction status, right eye: Secondary | ICD-10-CM | POA: Diagnosis not present

## 2019-09-26 DIAGNOSIS — I10 Essential (primary) hypertension: Secondary | ICD-10-CM | POA: Diagnosis not present

## 2019-09-26 DIAGNOSIS — E1165 Type 2 diabetes mellitus with hyperglycemia: Secondary | ICD-10-CM | POA: Diagnosis not present

## 2019-09-26 DIAGNOSIS — E782 Mixed hyperlipidemia: Secondary | ICD-10-CM | POA: Diagnosis not present

## 2019-09-27 DIAGNOSIS — I10 Essential (primary) hypertension: Secondary | ICD-10-CM | POA: Diagnosis not present

## 2019-09-27 DIAGNOSIS — K5901 Slow transit constipation: Secondary | ICD-10-CM | POA: Diagnosis not present

## 2019-10-02 ENCOUNTER — Ambulatory Visit: Payer: Self-pay | Attending: Internal Medicine

## 2019-10-02 DIAGNOSIS — Z23 Encounter for immunization: Secondary | ICD-10-CM

## 2019-10-02 NOTE — Progress Notes (Signed)
   Covid-19 Vaccination Clinic  Name:  JOLETTE LANA    MRN: [id number] DOB: 1955-07-23  10/02/2019  Ms. Weber was observed post Covid-19 immunization for 15 minutes without incident. She was provided with Vaccine Information Sheet and instruction to access the V-Safe system.   Ms. Latella was instructed to call 911 with any severe reactions post vaccine: Marland Kitchen Difficulty breathing  . Swelling of face and throat  . A fast heartbeat  . A bad rash all over body  . Dizziness and weakness   Immunizations Administered    Name Date Dose VIS Date Route   Pfizer COVID-19 Vaccine 10/02/2019 10:17 AM 0.3 mL 08/01/2018 Intramuscular   Manufacturer: [employer], [employer]   Lot: [id number]   NDC: [address]-6

## 2019-10-04 DIAGNOSIS — E782 Mixed hyperlipidemia: Secondary | ICD-10-CM | POA: Diagnosis not present

## 2019-10-04 DIAGNOSIS — Z794 Long term (current) use of insulin: Secondary | ICD-10-CM | POA: Diagnosis not present

## 2019-10-04 DIAGNOSIS — E1169 Type 2 diabetes mellitus with other specified complication: Secondary | ICD-10-CM | POA: Diagnosis not present

## 2019-10-04 DIAGNOSIS — I10 Essential (primary) hypertension: Secondary | ICD-10-CM | POA: Diagnosis not present

## 2019-10-04 DIAGNOSIS — Z0000 Encounter for general adult medical examination without abnormal findings: Secondary | ICD-10-CM | POA: Diagnosis not present

## 2019-10-04 DIAGNOSIS — E1165 Type 2 diabetes mellitus with hyperglycemia: Secondary | ICD-10-CM | POA: Diagnosis not present

## 2019-10-04 DIAGNOSIS — E6609 Other obesity due to excess calories: Secondary | ICD-10-CM | POA: Diagnosis not present

## 2019-10-08 DIAGNOSIS — E119 Type 2 diabetes mellitus without complications: Secondary | ICD-10-CM | POA: Diagnosis not present

## 2019-10-08 DIAGNOSIS — I10 Essential (primary) hypertension: Secondary | ICD-10-CM | POA: Diagnosis not present

## 2019-10-08 DIAGNOSIS — Z6830 Body mass index (BMI) 30.0-30.9, adult: Secondary | ICD-10-CM | POA: Diagnosis not present

## 2019-10-08 DIAGNOSIS — E782 Mixed hyperlipidemia: Secondary | ICD-10-CM | POA: Diagnosis not present

## 2020-01-23 DIAGNOSIS — E119 Type 2 diabetes mellitus without complications: Secondary | ICD-10-CM | POA: Diagnosis not present

## 2020-01-23 DIAGNOSIS — Z6830 Body mass index (BMI) 30.0-30.9, adult: Secondary | ICD-10-CM | POA: Diagnosis not present

## 2020-01-23 DIAGNOSIS — E782 Mixed hyperlipidemia: Secondary | ICD-10-CM | POA: Diagnosis not present

## 2020-01-23 DIAGNOSIS — I10 Essential (primary) hypertension: Secondary | ICD-10-CM | POA: Diagnosis not present

## 2020-02-01 DIAGNOSIS — Z23 Encounter for immunization: Secondary | ICD-10-CM | POA: Diagnosis not present

## 2020-07-24 DIAGNOSIS — Z6830 Body mass index (BMI) 30.0-30.9, adult: Secondary | ICD-10-CM | POA: Diagnosis not present

## 2020-07-24 DIAGNOSIS — E782 Mixed hyperlipidemia: Secondary | ICD-10-CM | POA: Diagnosis not present

## 2020-07-24 DIAGNOSIS — I10 Essential (primary) hypertension: Secondary | ICD-10-CM | POA: Diagnosis not present

## 2020-07-24 DIAGNOSIS — E119 Type 2 diabetes mellitus without complications: Secondary | ICD-10-CM | POA: Diagnosis not present

## 2020-07-24 DIAGNOSIS — E1165 Type 2 diabetes mellitus with hyperglycemia: Secondary | ICD-10-CM | POA: Diagnosis not present

## 2020-07-28 DIAGNOSIS — I10 Essential (primary) hypertension: Secondary | ICD-10-CM | POA: Diagnosis not present

## 2020-07-28 DIAGNOSIS — E782 Mixed hyperlipidemia: Secondary | ICD-10-CM | POA: Diagnosis not present

## 2020-07-28 DIAGNOSIS — M898X1 Other specified disorders of bone, shoulder: Secondary | ICD-10-CM | POA: Diagnosis not present

## 2020-07-28 DIAGNOSIS — E1169 Type 2 diabetes mellitus with other specified complication: Secondary | ICD-10-CM | POA: Diagnosis not present

## 2020-08-28 DIAGNOSIS — Z794 Long term (current) use of insulin: Secondary | ICD-10-CM | POA: Diagnosis not present

## 2020-08-28 DIAGNOSIS — E669 Obesity, unspecified: Secondary | ICD-10-CM | POA: Diagnosis not present

## 2020-08-28 DIAGNOSIS — E782 Mixed hyperlipidemia: Secondary | ICD-10-CM | POA: Diagnosis not present

## 2020-08-28 DIAGNOSIS — I10 Essential (primary) hypertension: Secondary | ICD-10-CM | POA: Diagnosis not present

## 2020-08-28 DIAGNOSIS — E1169 Type 2 diabetes mellitus with other specified complication: Secondary | ICD-10-CM | POA: Diagnosis not present

## 2020-08-29 ENCOUNTER — Other Ambulatory Visit: Payer: Self-pay

## 2020-08-29 ENCOUNTER — Ambulatory Visit
Admission: EM | Admit: 2020-08-29 | Discharge: 2020-08-29 | Disposition: A | Discharge status: Home / Self Care | Payer: Medicare Other | Attending: Emergency Medicine | Admitting: Emergency Medicine

## 2020-08-29 DIAGNOSIS — M545 Low back pain, unspecified: Secondary | ICD-10-CM

## 2020-08-29 DIAGNOSIS — Z7984 Long term (current) use of oral hypoglycemic drugs: Secondary | ICD-10-CM | POA: Insufficient documentation

## 2020-08-29 DIAGNOSIS — E119 Type 2 diabetes mellitus without complications: Secondary | ICD-10-CM | POA: Diagnosis not present

## 2020-08-29 DIAGNOSIS — M5459 Other low back pain: Secondary | ICD-10-CM | POA: Diagnosis not present

## 2020-08-29 HISTORY — DX: Type 2 diabetes mellitus without complications: E11.9

## 2020-08-29 LAB — POCT URINALYSIS DIP (MANUAL ENTRY)
Bilirubin, UA: NEGATIVE
Glucose, UA: NEGATIVE mg/dL
Ketones, POC UA: NEGATIVE mg/dL
Nitrite, UA: NEGATIVE
Protein Ur, POC: NEGATIVE mg/dL
Spec Grav, UA: 1.015 (ref 1.010–1.025)
Urobilinogen, UA: 0.2 E.U./dL
pH, UA: 8 (ref 5.0–8.0)

## 2020-08-29 MED ORDER — METHOCARBAMOL 500 MG PO TABS: 500.0000 mg | ORAL_TABLET | Freq: Two times a day (BID) | ORAL | 0 refills | Status: AC | PRN

## 2020-08-29 NOTE — Discharge Instructions (Addendum)
Continue taking ibuprofen or meloxicam as directed by your primary care provider.    Take the muscle relaxer as needed for muscle spasm; Do not drive, operate machinery, or drink alcohol with this medication as it can cause drowsiness.   Follow up with your primary care provider or an orthopedist if your symptoms are not improving.

## 2020-08-29 NOTE — ED Triage Notes (Signed)
Pt present lower back pain. Symptoms started over a week ago. Pt denies any injury to her back. Pt states the back pain has severely gotten worst.

## 2020-08-29 NOTE — ED Provider Notes (Signed)
[facility]    CSN: [id number] Arrival date & time: 08/29/20  1129      History   Chief Complaint Chief Complaint  Patient presents with  . Back Pain    HPI Courtney Ingram is a 65 y.o. female.   Accompanied by her son, patient presents with low back pain x1 week.  She denies falls or injury.  The pain is worse with movement and ambulation; improves with rest.  She denies numbness, weakness, paresthesias, saddle anesthesia, loss of bowel/bladder control, rash, wounds, bruising, redness, abdominal pain, dysuria, fever, or other symptoms.  Treatment attempted at home with meloxicam and ibuprofen.  Her medical history includes diabetes.  The history is provided by the patient and a relative.    Past Medical History:  Diagnosis Date  . Diabetes mellitus without complication [facility])     Patient Active Problem List   Diagnosis Date Noted  . DIABETES MELLITUS, TYPE II 01/02/2007    History reviewed. No pertinent surgical history.  OB History   No obstetric history on file.      Home Medications    Prior to Admission medications   Medication Sig Start Date End Date Taking? Authorizing Provider  irbesartan (AVAPRO) 300 MG tablet 1 tablet 07/28/20  Yes [provider]  meloxicam (MOBIC) 15 MG tablet 1 tablet,for pain 07/28/20  Yes [provider]  methocarbamol (ROBAXIN) 500 MG tablet Take 1 tablet (500 mg total) by mouth 2 (two) times daily as needed for muscle spasms. 08/29/20  Yes Mickie Bail, NP  Semaglutide,0.25 or 0.5MG /DOS, (OZEMPIC, 0.25 OR 0.5 MG/DOSE,) 2 MG/1.5ML SOPN 0.25 mg/wee 08/28/20  Yes [provider]  JANUMET XR 50-1000 MG TB24 Take 1 tablet by mouth 2 (two) times daily. 06/11/20   [provider]    Family History Family History  Family history unknown: Yes    Social History Social History   Tobacco Use  . Smoking status: Never Smoker  . Smokeless tobacco: Never Used     Allergies    Fluorescein   Review of Systems Review of Systems  Constitutional: Negative for chills and fever.  HENT: Negative for ear pain and sore throat.   Eyes: Negative for pain and visual disturbance.  Respiratory: Negative for cough and shortness of breath.   Cardiovascular: Negative for chest pain and palpitations.  Gastrointestinal: Negative for abdominal pain and vomiting.  Genitourinary: Negative for dysuria and hematuria.  Musculoskeletal: Positive for back pain and gait problem. Negative for arthralgias.  Skin: Negative for color change and rash.  Neurological: Negative for syncope, weakness and numbness.  All other systems reviewed and are negative.    Physical Exam Triage Vital Signs ED Triage Vitals  Enc Vitals Group     BP      Pulse      Resp      Temp      Temp src      SpO2      Weight      Height      Head Circumference      Peak Flow      Pain Score      Pain Loc      Pain Edu?      Excl. in GC?    No data found.  Updated Vital Signs BP 136/75 (BP Location: Left Arm)   Pulse 67   Temp 97.9 F (36.6 C) (Oral)   Resp 16   SpO2 94%   Visual Acuity  Right Eye Distance:   Left Eye Distance:   Bilateral Distance:    Right Eye Near:   Left Eye Near:    Bilateral Near:     Physical Exam Vitals and nursing note reviewed.  Constitutional:      General: She is not in acute distress.    Appearance: She is well-developed.  HENT:     Head: Normocephalic and atraumatic.     Mouth/Throat:     Mouth: Mucous membranes are moist.  Eyes:     Conjunctiva/sclera: Conjunctivae normal.  Cardiovascular:     Rate and Rhythm: Normal rate and regular rhythm.     Heart sounds: Normal heart sounds.  Pulmonary:     Effort: Pulmonary effort is normal. No respiratory distress.     Breath sounds: Normal breath sounds.  Abdominal:     Palpations: Abdomen is soft.     Tenderness: There is no abdominal tenderness.  Musculoskeletal:        General: No swelling,  tenderness, deformity or signs of injury. Normal range of motion.     Cervical back: Neck supple.  Skin:    General: Skin is warm and dry.     Capillary Refill: Capillary refill takes less than 2 seconds.     Findings: No bruising, erythema, lesion or rash.  Neurological:     General: No focal deficit present.     Mental Status: She is alert and oriented to person, place, and time.     Sensory: No sensory deficit.     Motor: No weakness.     Gait: Gait abnormal.     Comments: Negative straight leg raise.  Psychiatric:        Mood and Affect: Mood normal.        Behavior: Behavior normal.      UC Treatments / Results  Labs (all labs ordered are listed, but only abnormal results are displayed) Labs Reviewed  POCT URINALYSIS DIP (MANUAL ENTRY) - Abnormal; Notable for the following components:      Result Value   Blood, UA trace-intact (*)    Leukocytes, UA Trace (*)    All other components within normal limits  URINE CULTURE    EKG   Radiology No results found.  Procedures Procedures (including critical care time)  Medications Ordered in UC Medications - No data to display  Initial Impression / Assessment and Plan / UC Course  I have reviewed the triage vital signs and the nursing notes.  Pertinent labs & imaging results that were available during my care of the patient were reviewed by me and considered in my medical decision making (see chart for details).    Acute bilateral low back pain without sciatica.  Instructed patient to continue taking ibuprofen or meloxicam.  Also treating with Robaxin; precautions for drowsiness with this medication discussed.  Instructed patient and her son to follow-up with her PCP or an orthopedist if her symptoms are not improving.  They agree to plan of care.   Final Clinical Impressions(s) / UC Diagnoses   Final diagnoses:  Acute bilateral low back pain without sciatica     Discharge Instructions     Continue taking  ibuprofen or meloxicam as directed by your primary care provider.    Take the muscle relaxer as needed for muscle spasm; Do not drive, operate machinery, or drink alcohol with this medication as it can cause drowsiness.   Follow up with your primary care provider or an orthopedist if your symptoms  are not improving.        ED Prescriptions    Medication Sig Dispense Auth. Provider   methocarbamol (ROBAXIN) 500 MG tablet Take 1 tablet (500 mg total) by mouth 2 (two) times daily as needed for muscle spasms. 10 tablet Mickie Bail, NP     I have reviewed the PDMP during this encounter.   Mickie Bail, NP 08/29/20 1210

## 2020-08-30 LAB — URINE CULTURE: Culture: <10000 — AB

## 2020-09-03 DIAGNOSIS — M5459 Other low back pain: Secondary | ICD-10-CM | POA: Diagnosis not present

## 2020-09-10 DIAGNOSIS — Z794 Long term (current) use of insulin: Secondary | ICD-10-CM | POA: Diagnosis not present

## 2020-09-10 DIAGNOSIS — I10 Essential (primary) hypertension: Secondary | ICD-10-CM | POA: Diagnosis not present

## 2020-09-10 DIAGNOSIS — E669 Obesity, unspecified: Secondary | ICD-10-CM | POA: Diagnosis not present

## 2020-09-10 DIAGNOSIS — E1169 Type 2 diabetes mellitus with other specified complication: Secondary | ICD-10-CM | POA: Diagnosis not present

## 2020-09-10 DIAGNOSIS — E782 Mixed hyperlipidemia: Secondary | ICD-10-CM | POA: Diagnosis not present

## 2020-10-07 DIAGNOSIS — Z28311 Partially vaccinated for covid-19: Secondary | ICD-10-CM | POA: Diagnosis not present

## 2020-10-07 DIAGNOSIS — U071 COVID-19: Secondary | ICD-10-CM | POA: Diagnosis not present

## 2020-10-07 DIAGNOSIS — R058 Other specified cough: Secondary | ICD-10-CM | POA: Diagnosis not present

## 2020-10-07 DIAGNOSIS — R9431 Abnormal electrocardiogram [ECG] [EKG]: Secondary | ICD-10-CM | POA: Diagnosis not present

## 2020-10-07 DIAGNOSIS — J101 Influenza due to other identified influenza virus with other respiratory manifestations: Secondary | ICD-10-CM | POA: Diagnosis not present

## 2020-10-07 DIAGNOSIS — Z20822 Contact with and (suspected) exposure to covid-19: Secondary | ICD-10-CM | POA: Diagnosis not present

## 2020-10-07 DIAGNOSIS — R0602 Shortness of breath: Secondary | ICD-10-CM | POA: Diagnosis not present

## 2020-10-07 DIAGNOSIS — R059 Cough, unspecified: Secondary | ICD-10-CM | POA: Diagnosis not present

## 2020-10-07 DIAGNOSIS — R079 Chest pain, unspecified: Secondary | ICD-10-CM | POA: Diagnosis not present

## 2020-10-08 DIAGNOSIS — I10 Essential (primary) hypertension: Secondary | ICD-10-CM | POA: Diagnosis not present

## 2020-10-08 DIAGNOSIS — R9431 Abnormal electrocardiogram [ECG] [EKG]: Secondary | ICD-10-CM | POA: Diagnosis not present

## 2020-10-08 DIAGNOSIS — E669 Obesity, unspecified: Secondary | ICD-10-CM | POA: Diagnosis not present

## 2020-10-08 DIAGNOSIS — E782 Mixed hyperlipidemia: Secondary | ICD-10-CM | POA: Diagnosis not present

## 2020-10-08 DIAGNOSIS — E1169 Type 2 diabetes mellitus with other specified complication: Secondary | ICD-10-CM | POA: Diagnosis not present

## 2020-10-08 DIAGNOSIS — Z794 Long term (current) use of insulin: Secondary | ICD-10-CM | POA: Diagnosis not present

## 2020-10-17 DIAGNOSIS — E782 Mixed hyperlipidemia: Secondary | ICD-10-CM | POA: Diagnosis not present

## 2020-10-17 DIAGNOSIS — E119 Type 2 diabetes mellitus without complications: Secondary | ICD-10-CM | POA: Diagnosis not present

## 2020-10-17 DIAGNOSIS — I10 Essential (primary) hypertension: Secondary | ICD-10-CM | POA: Diagnosis not present

## 2020-10-24 DIAGNOSIS — H3341 Traction detachment of retina, right eye: Secondary | ICD-10-CM | POA: Diagnosis not present

## 2020-10-24 DIAGNOSIS — E113513 Type 2 diabetes mellitus with proliferative diabetic retinopathy with macular edema, bilateral: Secondary | ICD-10-CM | POA: Diagnosis not present

## 2020-10-24 DIAGNOSIS — E1169 Type 2 diabetes mellitus with other specified complication: Secondary | ICD-10-CM | POA: Diagnosis not present

## 2020-10-24 DIAGNOSIS — Z961 Presence of intraocular lens: Secondary | ICD-10-CM | POA: Diagnosis not present

## 2020-10-24 DIAGNOSIS — H4053X0 Glaucoma secondary to other eye disorders, bilateral, stage unspecified: Secondary | ICD-10-CM | POA: Diagnosis not present

## 2020-10-24 DIAGNOSIS — H4312 Vitreous hemorrhage, left eye: Secondary | ICD-10-CM | POA: Diagnosis not present

## 2020-10-24 DIAGNOSIS — E782 Mixed hyperlipidemia: Secondary | ICD-10-CM | POA: Diagnosis not present

## 2020-10-24 DIAGNOSIS — Z794 Long term (current) use of insulin: Secondary | ICD-10-CM | POA: Diagnosis not present

## 2020-10-24 DIAGNOSIS — I10 Essential (primary) hypertension: Secondary | ICD-10-CM | POA: Diagnosis not present

## 2020-10-24 DIAGNOSIS — Z0000 Encounter for general adult medical examination without abnormal findings: Secondary | ICD-10-CM | POA: Diagnosis not present

## 2020-10-24 DIAGNOSIS — H35033 Hypertensive retinopathy, bilateral: Secondary | ICD-10-CM | POA: Diagnosis not present

## 2020-10-24 DIAGNOSIS — Z7984 Long term (current) use of oral hypoglycemic drugs: Secondary | ICD-10-CM | POA: Diagnosis not present

## 2020-11-14 DIAGNOSIS — Z23 Encounter for immunization: Secondary | ICD-10-CM | POA: Diagnosis not present

## 2020-11-28 DIAGNOSIS — H6123 Impacted cerumen, bilateral: Secondary | ICD-10-CM | POA: Diagnosis not present

## 2020-12-04 DIAGNOSIS — Z01419 Encounter for gynecological examination (general) (routine) without abnormal findings: Secondary | ICD-10-CM | POA: Diagnosis not present

## 2020-12-04 DIAGNOSIS — E119 Type 2 diabetes mellitus without complications: Secondary | ICD-10-CM | POA: Diagnosis not present

## 2020-12-04 DIAGNOSIS — N898 Other specified noninflammatory disorders of vagina: Secondary | ICD-10-CM | POA: Diagnosis not present

## 2020-12-04 DIAGNOSIS — Z794 Long term (current) use of insulin: Secondary | ICD-10-CM | POA: Diagnosis not present

## 2020-12-04 DIAGNOSIS — Z1151 Encounter for screening for human papillomavirus (HPV): Secondary | ICD-10-CM | POA: Diagnosis not present

## 2020-12-04 DIAGNOSIS — Z01411 Encounter for gynecological examination (general) (routine) with abnormal findings: Secondary | ICD-10-CM | POA: Diagnosis not present

## 2020-12-04 DIAGNOSIS — Z7984 Long term (current) use of oral hypoglycemic drugs: Secondary | ICD-10-CM | POA: Diagnosis not present

## 2021-01-20 DIAGNOSIS — E119 Type 2 diabetes mellitus without complications: Secondary | ICD-10-CM | POA: Diagnosis not present

## 2021-01-20 DIAGNOSIS — E669 Obesity, unspecified: Secondary | ICD-10-CM | POA: Diagnosis not present

## 2021-01-20 DIAGNOSIS — E782 Mixed hyperlipidemia: Secondary | ICD-10-CM | POA: Diagnosis not present

## 2021-01-20 DIAGNOSIS — Z794 Long term (current) use of insulin: Secondary | ICD-10-CM | POA: Diagnosis not present

## 2021-01-20 DIAGNOSIS — E1169 Type 2 diabetes mellitus with other specified complication: Secondary | ICD-10-CM | POA: Diagnosis not present

## 2021-01-20 DIAGNOSIS — I10 Essential (primary) hypertension: Secondary | ICD-10-CM | POA: Diagnosis not present

## 2021-02-18 DIAGNOSIS — E782 Mixed hyperlipidemia: Secondary | ICD-10-CM | POA: Diagnosis not present

## 2021-02-18 DIAGNOSIS — Z794 Long term (current) use of insulin: Secondary | ICD-10-CM | POA: Diagnosis not present

## 2021-02-18 DIAGNOSIS — E1169 Type 2 diabetes mellitus with other specified complication: Secondary | ICD-10-CM | POA: Diagnosis not present

## 2021-02-18 DIAGNOSIS — E119 Type 2 diabetes mellitus without complications: Secondary | ICD-10-CM | POA: Diagnosis not present

## 2021-02-18 DIAGNOSIS — E669 Obesity, unspecified: Secondary | ICD-10-CM | POA: Diagnosis not present

## 2021-02-18 DIAGNOSIS — I10 Essential (primary) hypertension: Secondary | ICD-10-CM | POA: Diagnosis not present

## 2021-02-27 DIAGNOSIS — E7800 Pure hypercholesterolemia, unspecified: Secondary | ICD-10-CM | POA: Diagnosis not present

## 2021-02-27 DIAGNOSIS — I10 Essential (primary) hypertension: Secondary | ICD-10-CM | POA: Diagnosis not present

## 2021-02-27 DIAGNOSIS — E559 Vitamin D deficiency, unspecified: Secondary | ICD-10-CM | POA: Diagnosis not present

## 2021-02-28 DIAGNOSIS — E559 Vitamin D deficiency, unspecified: Secondary | ICD-10-CM | POA: Diagnosis not present

## 2021-02-28 DIAGNOSIS — I10 Essential (primary) hypertension: Secondary | ICD-10-CM | POA: Diagnosis not present

## 2021-02-28 DIAGNOSIS — E7800 Pure hypercholesterolemia, unspecified: Secondary | ICD-10-CM | POA: Diagnosis not present

## 2022-02-09 DIAGNOSIS — S61312A Laceration without foreign body of right middle finger with damage to nail, initial encounter: Secondary | ICD-10-CM | POA: Diagnosis not present

## 2022-02-17 ENCOUNTER — Emergency Department (HOSPITAL_COMMUNITY)
Admission: EM | Admit: 2022-02-17 | Discharge: 2022-02-17 | Disposition: A | Discharge status: Home / Self Care | Payer: Medicare Other | Attending: Emergency Medicine | Admitting: Emergency Medicine

## 2022-02-17 ENCOUNTER — Encounter (HOSPITAL_COMMUNITY): Payer: Self-pay | Admitting: Pharmacy Technician

## 2022-02-17 ENCOUNTER — Other Ambulatory Visit: Payer: Self-pay

## 2022-02-17 DIAGNOSIS — W230XXD Caught, crushed, jammed, or pinched between moving objects, subsequent encounter: Secondary | ICD-10-CM | POA: Insufficient documentation

## 2022-02-17 DIAGNOSIS — Z4802 Encounter for removal of sutures: Secondary | ICD-10-CM | POA: Diagnosis not present

## 2022-02-17 DIAGNOSIS — S61212D Laceration without foreign body of right middle finger without damage to nail, subsequent encounter: Secondary | ICD-10-CM | POA: Diagnosis not present

## 2022-02-17 DIAGNOSIS — L02511 Cutaneous abscess of right hand: Secondary | ICD-10-CM | POA: Diagnosis not present

## 2022-02-17 MED ORDER — LIDOCAINE HCL 2 % IJ SOLN
10.0000 mL | Freq: Once | INTRAMUSCULAR | Status: AC
  Administered 2022-02-17: 200 mg via INTRADERMAL
  Filled 2022-02-17: qty 20

## 2022-02-17 MED ORDER — SULFAMETHOXAZOLE-TRIMETHOPRIM 800-160 MG PO TABS: 1.0000 | ORAL_TABLET | Freq: Two times a day (BID) | ORAL | 0 refills | Status: AC

## 2022-02-17 NOTE — ED Notes (Signed)
Pt provided extra sterile gauze and kerlix for home lac care.

## 2022-02-17 NOTE — ED Provider Notes (Signed)
[facility]-EMERGENCY DEPT Provider Note   CSN: [id number] Arrival date & time: 02/17/22  1816     History  Chief Complaint  Patient presents with   Suture / Staple Removal    Courtney Ingram is a 66 y.o. female.   Suture / Staple Removal     66 year old female presenting requesting for suture removal.  Patient reports she jammed her right middle finger against a car door a week ago and had sutures placed.  Since then she endorsed progressive worsening throbbing pain about the finger and she voiced concerns for infection.  Reports have history of diabetes.  She is here to have the sutures removed and further management of her condition.  She denies any fever or numbness.  She is up-to-date with tetanus.  Home Medications Prior to Admission medications   Medication Sig Start Date End Date Taking? Authorizing Provider  irbesartan (AVAPRO) 300 MG tablet 1 tablet 07/28/20   [provider]  JANUMET XR 50-1000 MG TB24 Take 1 tablet by mouth 2 (two) times daily. 06/11/20   [provider]  meloxicam (MOBIC) 15 MG tablet 1 tablet,for pain 07/28/20   [provider]  methocarbamol (ROBAXIN) 500 MG tablet Take 1 tablet (500 mg total) by mouth 2 (two) times daily as needed for muscle spasms. 08/29/20   Mickie Bail, NP  Semaglutide,0.25 or 0.5MG /DOS, (OZEMPIC, 0.25 OR 0.5 MG/DOSE,) 2 MG/1.5ML SOPN 0.25 mg/wee 08/28/20   [provider]      Allergies    Fluorescein    Review of Systems   Review of Systems  All other systems reviewed and are negative.   Physical Exam Updated Vital Signs BP (!) 144/64 (BP Location: Left Arm)   Pulse 75   Temp 98.5 F (36.9 C) (Oral)   Resp 18   SpO2 93%  Physical Exam Vitals and nursing note reviewed.  Constitutional:      General: She is not in acute distress.    Appearance: She is well-developed.  HENT:     Head: Atraumatic.  Eyes:     Conjunctiva/sclera: Conjunctivae normal.   Pulmonary:     Effort: Pulmonary effort is normal.  Musculoskeletal:        General: Signs of injury (Right middle finger.  There is a 2 cm laceration noted to the pad of the finger with sutures in place.  Area is erythematous and edematous and tender to palpation with some mild fluctuant.  No nail involvement.  Brisk cap refill.) present.     Cervical back: Neck supple.  Skin:    Findings: No rash.  Neurological:     Mental Status: She is alert.  Psychiatric:        Mood and Affect: Mood normal.     ED Results / Procedures / Treatments   Labs (all labs ordered are listed, but only abnormal results are displayed) Labs Reviewed - No data to display  EKG None  Radiology No results found.  Procedures .Marland KitchenIncision and Drainage  Date/Time: 02/17/2022 7:35 PM  Performed by: Fayrene Helper, PA-C Authorized by: Fayrene Helper, PA-C   Consent:    Consent obtained:  Verbal   Consent given by:  Patient   Risks discussed:  Bleeding   Alternatives discussed:  No treatment Universal protocol:    Procedure explained and questions answered to patient or proxy's satisfaction: yes     Relevant documents present and verified: yes     Patient identity confirmed:  Verbally with patient  Location:    Type:  Abscess   Size:  1   Location:  Upper extremity   Upper extremity location:  Finger   Finger location:  R long finger Pre-procedure details:    Skin preparation:  Antiseptic wash Sedation:    Sedation type:  None Anesthesia:    Anesthesia method:  Nerve block   Block location:  Digital   Block needle gauge:  25 G   Block anesthetic:  Lidocaine 2% w/o epi   Block technique:  Digital nerve block   Block injection procedure:  Introduced needle, incremental injection, anatomic landmarks palpated, negative aspiration for blood and anatomic landmarks identified   Block outcome:  Anesthesia achieved Procedure type:    Complexity:  Simple Procedure details:    Ultrasound guidance: no      Needle aspiration: no     Incision types:  Stab incision   Incision depth:  Subcutaneous   Wound management:  Probed and deloculated   Drainage:  Purulent   Drainage amount:  Scant   Wound treatment:  Wound left open   Packing materials:  None Post-procedure details:    Procedure completion:  Tolerated well, no immediate complications     Medications Ordered in ED Medications - No data to display  ED Course/ Medical Decision Making/ A&P                           Medical Decision Making Risk Prescription drug management.   BP (!) 144/64 (BP Location: Left Arm)   Pulse 75   Temp 98.5 F (36.9 C) (Oral)   Resp 18   SpO2 93%   7:19 PM Patient injured her right middle finger at the pad of the finger a week ago when she was struck with a car door.  She had her laceration repaired but unfortunately today she has signs of infection about the laceration site.  The area is erythematous and edematous with some fluctuant noted.  We will remove sutures, clean out the wound, and will need to treat with antibiotic.  7:36 PM I have performed I&D after sutures removal.  Wound were dressed by me.  Wound care instruction provided.  D/c with bactrim abx.  Return precaution given.   SUTURE REMOVAL Performed by: Fayrene Helper  Consent: Verbal consent obtained. Patient identity confirmed: provided demographic data Time out: Immediately prior to procedure a "time out" was called to verify the correct patient, procedure, equipment, support staff and site/side marked as required.  Location details: R middle finger, pad  Wound Appearance: infected  Sutures/Staples Removed: sutures (4)  Facility: sutures placed in this facility Patient tolerance: Patient tolerated the procedure well with no immediate complications.          Final Clinical Impression(s) / ED Diagnoses Final diagnoses:  Visit for suture removal  Abscess of right middle finger    Rx / DC Orders ED Discharge Orders           Ordered    sulfamethoxazole-trimethoprim (BACTRIM DS) 800-160 MG tablet  2 times daily        02/17/22 1938              Fayrene Helper, PA-C 02/17/22 1939    Linwood Dibbles, MD 02/18/22 1559

## 2022-02-17 NOTE — ED Triage Notes (Signed)
Pt here for suture removal

## 2022-02-23 ENCOUNTER — Telehealth: Payer: Self-pay | Admitting: *Deleted

## 2022-02-23 NOTE — Telephone Encounter (Signed)
     Patient  visit on 02/17/2022  at [facility]  was for Stitches  Have you been able to follow up with your primary care physician? patient not knowing if she had a PCP so sending her a list of available ones per her request  The patient was or was not able to obtain any needed medicine or equipment.  Are there diet recommendations that you are having difficulty following?  Patient expresses understanding of discharge instructions and education provided has no other needs at this time.    [place] [phone] [address] , [place] [address] Email : [email or web address]

## 2022-03-04 DIAGNOSIS — E1169 Type 2 diabetes mellitus with other specified complication: Secondary | ICD-10-CM | POA: Diagnosis not present

## 2022-03-04 DIAGNOSIS — E1165 Type 2 diabetes mellitus with hyperglycemia: Secondary | ICD-10-CM | POA: Diagnosis not present

## 2022-03-04 DIAGNOSIS — E782 Mixed hyperlipidemia: Secondary | ICD-10-CM | POA: Diagnosis not present

## 2022-03-04 DIAGNOSIS — I10 Essential (primary) hypertension: Secondary | ICD-10-CM | POA: Diagnosis not present

## 2022-03-04 DIAGNOSIS — R229 Localized swelling, mass and lump, unspecified: Secondary | ICD-10-CM | POA: Diagnosis not present

## 2022-03-05 DIAGNOSIS — E119 Type 2 diabetes mellitus without complications: Secondary | ICD-10-CM | POA: Diagnosis not present

## 2022-03-05 DIAGNOSIS — Z6830 Body mass index (BMI) 30.0-30.9, adult: Secondary | ICD-10-CM | POA: Diagnosis not present

## 2022-03-05 DIAGNOSIS — I10 Essential (primary) hypertension: Secondary | ICD-10-CM | POA: Diagnosis not present

## 2022-03-05 DIAGNOSIS — Z0000 Encounter for general adult medical examination without abnormal findings: Secondary | ICD-10-CM | POA: Diagnosis not present

## 2022-03-10 DIAGNOSIS — E782 Mixed hyperlipidemia: Secondary | ICD-10-CM | POA: Diagnosis not present

## 2022-03-10 DIAGNOSIS — L03011 Cellulitis of right finger: Secondary | ICD-10-CM | POA: Diagnosis not present

## 2022-03-10 DIAGNOSIS — Z794 Long term (current) use of insulin: Secondary | ICD-10-CM | POA: Diagnosis not present

## 2022-03-10 DIAGNOSIS — Z0000 Encounter for general adult medical examination without abnormal findings: Secondary | ICD-10-CM | POA: Diagnosis not present

## 2022-03-10 DIAGNOSIS — I10 Essential (primary) hypertension: Secondary | ICD-10-CM | POA: Diagnosis not present

## 2022-03-10 DIAGNOSIS — E1169 Type 2 diabetes mellitus with other specified complication: Secondary | ICD-10-CM | POA: Diagnosis not present

## 2022-08-30 ENCOUNTER — Telehealth: Payer: Self-pay | Admitting: *Deleted

## 2022-08-30 NOTE — Progress Notes (Unsigned)
  Care Coordination  Outreach Note  08/30/2022 Name: Courtney Ingram MRN: [id number] DOB: [birth year]   Care Coordination Outreach Attempts: An unsuccessful telephone outreach was attempted today to offer the patient information about available care coordination services as a benefit of their health plan.   Follow Up Plan:  Additional outreach attempts will be made to offer the patient care coordination information and services.   Encounter Outcome:  No Answer  [place]  Direct Dial: [phone]

## 2022-09-01 NOTE — Progress Notes (Signed)
  Care Coordination  Outreach Note  09/01/2022 Name: Courtney Ingram MRN: [id number] DOB: Mar 02, 1956   Care Coordination Outreach Attempts: A second unsuccessful outreach was attempted today to offer the patient with information about available care coordination services as a benefit of their health plan.     Follow Up Plan:  Additional outreach attempts will be made to offer the patient care coordination information and services.   Encounter Outcome:  No Answer  [place]  Direct Dial: [phone]

## 2022-09-02 NOTE — Progress Notes (Signed)
  Care Coordination  Outreach Note  09/02/2022 Name: RICQUEL DUNGAN MRN: [id number] DOB: 1955/09/29   Care Coordination Outreach Attempts: A third unsuccessful outreach was attempted today to offer the patient with information about available care coordination services as a benefit of their health plan.   Follow Up Plan:  No further outreach attempts will be made at this time. We have been unable to contact the patient to offer or enroll patient in care coordination services  Encounter Outcome:  No Answer  [place]: [phone]

## 2022-11-03 DIAGNOSIS — E782 Mixed hyperlipidemia: Secondary | ICD-10-CM | POA: Diagnosis not present

## 2022-11-03 DIAGNOSIS — E1169 Type 2 diabetes mellitus with other specified complication: Secondary | ICD-10-CM | POA: Diagnosis not present

## 2022-11-03 DIAGNOSIS — E1165 Type 2 diabetes mellitus with hyperglycemia: Secondary | ICD-10-CM | POA: Diagnosis not present

## 2022-11-03 DIAGNOSIS — I10 Essential (primary) hypertension: Secondary | ICD-10-CM | POA: Diagnosis not present

## 2023-04-06 DIAGNOSIS — E782 Mixed hyperlipidemia: Secondary | ICD-10-CM | POA: Diagnosis not present

## 2023-04-06 DIAGNOSIS — I10 Essential (primary) hypertension: Secondary | ICD-10-CM | POA: Diagnosis not present

## 2023-04-06 DIAGNOSIS — E1165 Type 2 diabetes mellitus with hyperglycemia: Secondary | ICD-10-CM | POA: Diagnosis not present

## 2023-04-06 DIAGNOSIS — Z0000 Encounter for general adult medical examination without abnormal findings: Secondary | ICD-10-CM | POA: Diagnosis not present

## 2023-04-06 DIAGNOSIS — E1169 Type 2 diabetes mellitus with other specified complication: Secondary | ICD-10-CM | POA: Diagnosis not present

## 2023-04-12 ENCOUNTER — Other Ambulatory Visit (HOSPITAL_COMMUNITY): Payer: Self-pay

## 2023-04-12 DIAGNOSIS — Z0000 Encounter for general adult medical examination without abnormal findings: Secondary | ICD-10-CM | POA: Diagnosis not present

## 2023-04-12 DIAGNOSIS — Z794 Long term (current) use of insulin: Secondary | ICD-10-CM | POA: Diagnosis not present

## 2023-04-12 DIAGNOSIS — E1169 Type 2 diabetes mellitus with other specified complication: Secondary | ICD-10-CM | POA: Diagnosis not present

## 2023-04-12 DIAGNOSIS — E782 Mixed hyperlipidemia: Secondary | ICD-10-CM | POA: Diagnosis not present

## 2023-04-12 DIAGNOSIS — I10 Essential (primary) hypertension: Secondary | ICD-10-CM | POA: Diagnosis not present

## 2023-04-13 DIAGNOSIS — Z1151 Encounter for screening for human papillomavirus (HPV): Secondary | ICD-10-CM | POA: Diagnosis not present

## 2023-04-13 DIAGNOSIS — Z6829 Body mass index (BMI) 29.0-29.9, adult: Secondary | ICD-10-CM | POA: Diagnosis not present

## 2023-04-13 DIAGNOSIS — N3941 Urge incontinence: Secondary | ICD-10-CM | POA: Diagnosis not present

## 2023-04-13 DIAGNOSIS — Z124 Encounter for screening for malignant neoplasm of cervix: Secondary | ICD-10-CM | POA: Diagnosis not present

## 2023-04-13 DIAGNOSIS — Z1231 Encounter for screening mammogram for malignant neoplasm of breast: Secondary | ICD-10-CM | POA: Diagnosis not present

## 2023-04-14 DIAGNOSIS — Z8639 Personal history of other endocrine, nutritional and metabolic disease: Secondary | ICD-10-CM | POA: Diagnosis not present

## 2023-04-14 DIAGNOSIS — R5383 Other fatigue: Secondary | ICD-10-CM | POA: Diagnosis not present

## 2023-04-18 ENCOUNTER — Other Ambulatory Visit: Payer: Self-pay | Admitting: Obstetrics and Gynecology

## 2023-04-18 DIAGNOSIS — R928 Other abnormal and inconclusive findings on diagnostic imaging of breast: Secondary | ICD-10-CM

## 2023-06-20 ENCOUNTER — Other Ambulatory Visit: Payer: Self-pay | Admitting: Obstetrics and Gynecology

## 2023-06-20 ENCOUNTER — Ambulatory Visit
Admission: RE | Admit: 2023-06-20 | Discharge: 2023-06-20 | Disposition: A | Discharge status: Home / Self Care | Payer: Medicare Other | Source: Ambulatory Visit | Attending: Obstetrics and Gynecology | Admitting: Obstetrics and Gynecology

## 2023-06-20 DIAGNOSIS — N6321 Unspecified lump in the left breast, upper outer quadrant: Secondary | ICD-10-CM | POA: Diagnosis not present

## 2023-06-20 DIAGNOSIS — N6320 Unspecified lump in the left breast, unspecified quadrant: Secondary | ICD-10-CM

## 2023-06-20 DIAGNOSIS — R590 Localized enlarged lymph nodes: Secondary | ICD-10-CM

## 2023-06-20 DIAGNOSIS — R928 Other abnormal and inconclusive findings on diagnostic imaging of breast: Secondary | ICD-10-CM

## 2023-06-21 ENCOUNTER — Ambulatory Visit
Admission: RE | Admit: 2023-06-21 | Discharge: 2023-06-21 | Disposition: A | Discharge status: Home / Self Care | Payer: Medicare Other | Source: Ambulatory Visit | Attending: Obstetrics and Gynecology | Admitting: Obstetrics and Gynecology

## 2023-06-21 ENCOUNTER — Ambulatory Visit
Admission: RE | Admit: 2023-06-21 | Discharge: 2023-06-21 | Discharge status: Home / Self Care | Payer: Medicare Other | Source: Ambulatory Visit | Attending: Obstetrics and Gynecology | Admitting: Obstetrics and Gynecology

## 2023-06-21 DIAGNOSIS — N6320 Unspecified lump in the left breast, unspecified quadrant: Secondary | ICD-10-CM

## 2023-06-21 DIAGNOSIS — R590 Localized enlarged lymph nodes: Secondary | ICD-10-CM

## 2023-06-21 DIAGNOSIS — D479 Neoplasm of uncertain behavior of lymphoid, hematopoietic and related tissue, unspecified: Secondary | ICD-10-CM | POA: Diagnosis not present

## 2023-06-21 DIAGNOSIS — N6321 Unspecified lump in the left breast, upper outer quadrant: Secondary | ICD-10-CM | POA: Diagnosis not present

## 2023-06-21 DIAGNOSIS — C8590 Non-Hodgkin lymphoma, unspecified, unspecified site: Secondary | ICD-10-CM | POA: Diagnosis not present

## 2023-06-21 HISTORY — PX: BREAST BIOPSY: SHX20

## 2023-06-23 ENCOUNTER — Other Ambulatory Visit (HOSPITAL_COMMUNITY): Payer: Medicare Other

## 2023-06-25 ENCOUNTER — Emergency Department: Payer: Medicare Other

## 2023-06-25 ENCOUNTER — Ambulatory Visit: Admission: EM | Admit: 2023-06-25 | Discharge: 2023-06-25 | Payer: Medicare Other

## 2023-06-25 ENCOUNTER — Other Ambulatory Visit: Payer: Self-pay

## 2023-06-25 ENCOUNTER — Emergency Department
Admission: EM | Admit: 2023-06-25 | Discharge: 2023-06-25 | Disposition: A | Discharge status: Home / Self Care | Payer: Medicare Other | Attending: Student in an Organized Health Care Education/Training Program | Admitting: Student in an Organized Health Care Education/Training Program

## 2023-06-25 DIAGNOSIS — K760 Fatty (change of) liver, not elsewhere classified: Secondary | ICD-10-CM | POA: Diagnosis not present

## 2023-06-25 DIAGNOSIS — R109 Unspecified abdominal pain: Secondary | ICD-10-CM

## 2023-06-25 DIAGNOSIS — A0811 Acute gastroenteropathy due to Norwalk agent: Secondary | ICD-10-CM | POA: Insufficient documentation

## 2023-06-25 DIAGNOSIS — R197 Diarrhea, unspecified: Secondary | ICD-10-CM | POA: Insufficient documentation

## 2023-06-25 DIAGNOSIS — D259 Leiomyoma of uterus, unspecified: Secondary | ICD-10-CM | POA: Diagnosis not present

## 2023-06-25 DIAGNOSIS — R1013 Epigastric pain: Secondary | ICD-10-CM | POA: Insufficient documentation

## 2023-06-25 DIAGNOSIS — A044 Other intestinal Escherichia coli infections: Secondary | ICD-10-CM | POA: Diagnosis not present

## 2023-06-25 LAB — URINALYSIS, ROUTINE W REFLEX MICROSCOPIC
Bacteria, UA: NONE SEEN
Bilirubin Urine: NEGATIVE
Glucose, UA: >=500 mg/dL — AB
Hgb urine dipstick: NEGATIVE
Ketones, ur: 5 mg/dL — AB
Leukocytes,Ua: NEGATIVE
Nitrite: NEGATIVE
Protein, ur: NEGATIVE mg/dL
Specific Gravity, Urine: >1.046 — ABNORMAL HIGH (ref 1.005–1.030)
pH: 5 (ref 5.0–8.0)

## 2023-06-25 LAB — CBC
HCT: 42.1 % (ref 36.0–46.0)
Hemoglobin: 13.5 g/dL (ref 12.0–15.0)
MCH: 30 pg (ref 26.0–34.0)
MCHC: 32.1 g/dL (ref 30.0–36.0)
MCV: 93.6 fL (ref 80.0–100.0)
Platelets: 252 10*3/uL (ref 150–400)
RBC: 4.5 MIL/uL (ref 3.87–5.11)
RDW: 14.2 % (ref 11.5–15.5)
WBC: 4.4 10*3/uL (ref 4.0–10.5)
nRBC: 0 % (ref 0.0–0.2)

## 2023-06-25 LAB — GASTROINTESTINAL PANEL BY PCR, STOOL (REPLACES STOOL CULTURE)
Adenovirus F40/41: NOT DETECTED
Astrovirus: NOT DETECTED
Campylobacter species: NOT DETECTED
Cryptosporidium: NOT DETECTED
Cyclospora cayetanensis: NOT DETECTED
Entamoeba histolytica: NOT DETECTED
Enteroaggregative E coli (EAEC): DETECTED — AB
Enteropathogenic E coli (EPEC): DETECTED — AB
Enterotoxigenic E coli (ETEC): DETECTED — AB
Giardia lamblia: NOT DETECTED
Norovirus GI/GII: DETECTED — AB
Plesimonas shigelloides: NOT DETECTED
Rotavirus A: NOT DETECTED
Salmonella species: NOT DETECTED
Sapovirus (I, II, IV, and V): NOT DETECTED
Shiga like toxin producing E coli (STEC): NOT DETECTED
Shigella/Enteroinvasive E coli (EIEC): DETECTED — AB
Vibrio cholerae: NOT DETECTED
Vibrio species: NOT DETECTED
Yersinia enterocolitica: NOT DETECTED

## 2023-06-25 LAB — COMPREHENSIVE METABOLIC PANEL
ALT: 30 U/L (ref 0–44)
AST: 24 U/L (ref 15–41)
Albumin: 3.8 g/dL (ref 3.5–5.0)
Alkaline Phosphatase: 54 U/L (ref 38–126)
Anion gap: 9 (ref 5–15)
BUN: 17 mg/dL (ref 8–23)
CO2: 22 mmol/L (ref 22–32)
Calcium: 8.8 mg/dL — ABNORMAL LOW (ref 8.9–10.3)
Chloride: 102 mmol/L (ref 98–111)
Creatinine, Ser: 0.56 mg/dL (ref 0.44–1.00)
GFR, Estimated: >60 mL/min (ref >60)
Glucose, Bld: 115 mg/dL — ABNORMAL HIGH (ref 70–99)
Potassium: 3.7 mmol/L (ref 3.5–5.1)
Sodium: 133 mmol/L — ABNORMAL LOW (ref 135–145)
Total Bilirubin: 0.7 mg/dL (ref 0.0–1.2)
Total Protein: 7.5 g/dL (ref 6.5–8.1)

## 2023-06-25 LAB — TYPE AND SCREEN
ABO/RH(D): A POS
Antibody Screen: NEGATIVE

## 2023-06-25 LAB — C DIFFICILE QUICK SCREEN W PCR REFLEX
C Diff antigen: NEGATIVE
C Diff interpretation: NOT DETECTED
C Diff toxin: NEGATIVE

## 2023-06-25 LAB — OCCULT BLOOD X 1 CARD TO LAB, STOOL: Fecal Occult Bld: NEGATIVE

## 2023-06-25 LAB — LIPASE, BLOOD: Lipase: 47 U/L (ref 11–51)

## 2023-06-25 MED ORDER — AZITHROMYCIN 250 MG PO TABS: ORAL_TABLET | ORAL | 0 refills | Status: AC

## 2023-06-25 MED ORDER — IOHEXOL 300 MG/ML  SOLN
100.0000 mL | Freq: Once | INTRAMUSCULAR | Status: AC | PRN
  Administered 2023-06-25: 100 mL via INTRAVENOUS

## 2023-06-25 MED ORDER — ONDANSETRON 4 MG PO TBDP: 4.0000 mg | ORAL_TABLET | Freq: Three times a day (TID) | ORAL | 0 refills | Status: AC | PRN

## 2023-06-25 NOTE — ED Notes (Signed)
GI panel called by lab: ETEC EAEC EPEC EIEC Norovirus  Informed EDP, pending discharge.   Also urine recollected, lab does not have.

## 2023-06-25 NOTE — ED Notes (Signed)
EDP will come speak with pt about GI panel before discharge.

## 2023-06-25 NOTE — Discharge Instructions (Addendum)
CLINICAL DATA:  Four days of black stools with nausea and abdominal pain   EXAM: CT ABDOMEN AND PELVIS WITH CONTRAST   TECHNIQUE: Multidetector CT imaging of the abdomen and pelvis was performed using the standard protocol following bolus administration of intravenous contrast.   RADIATION DOSE REDUCTION: This exam was performed according to the departmental dose-optimization program which includes automated exposure control, adjustment of the mA and/or kV according to patient size and/or use of iterative reconstruction technique.   CONTRAST:  OMNIPAQUE IOHEXOL 300 MG/ML  SOLN   COMPARISON:  None Available.   FINDINGS: Lower chest: No acute abnormality.   Hepatobiliary: Hepatic steatosis. Normal gallbladder. No biliary dilation.   Pancreas: Unremarkable.   Spleen: Unremarkable.   Adrenals/Urinary Tract: Unremarkable right adrenal gland. 1.4 cm intermediate density nodule (Hounsfield units 68) in the left adrenal gland. No urinary calculi or hydronephrosis. Bladder is unremarkable.   Stomach/Bowel: Normal caliber large and small bowel. No bowel wall thickening. The appendix is not visualized.Stomach is within normal limits.   Vascular/Lymphatic: No significant vascular findings are present. No enlarged abdominal or pelvic lymph nodes.   Reproductive: Calcified fibroids in the uterus.  No adnexal mass.   Other: No free intraperitoneal fluid or air.   Musculoskeletal: No acute fracture.   IMPRESSION: 1. No acute abnormality in the abdomen or pelvis. 2. Hepatic steatosis. 3. 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 Consider follow-up adrenal protocol CT in 1 year.

## 2023-06-25 NOTE — ED Provider Notes (Signed)
[facility] Provider Note    Event Date/Time   First MD Initiated Contact with Patient 06/25/23 1258     (approximate)   History   Abdominal Pain and Melena   HPI  Courtney Ingram is a 68 y.o. female who presents to the ER for evaluation of dark stool diarrhea over the past few days.  Recently post biopsy of breast tissue.  Does have some epigastric discomfort but moving her bowels otherwise normally no constipation.  Still passing gas.  No hematochezia.  Denies any new medications.  No history of GI bleeding or ulcer.  She is not on any anticoagulation.     Physical Exam   Triage Vital Signs: ED Triage Vitals  Encounter Vitals Group     BP 06/25/23 1143 (!) 141/60     Systolic BP Percentile --      Diastolic BP Percentile --      Pulse Rate 06/25/23 1143 69     Resp 06/25/23 1143 16     Temp 06/25/23 1143 98.9 F (37.2 C)     Temp Source 06/25/23 1143 Oral     SpO2 06/25/23 1143 96 %     Weight 06/25/23 1145 168 lb (76.2 kg)     Height 06/25/23 1145 5\' 5"  (1.651 m)     Head Circumference --      Peak Flow --      Pain Score 06/25/23 1141 8     Pain Loc --      Pain Education --      Exclude from Growth Chart --     Most recent vital signs: Vitals:   06/25/23 1143  BP: (!) 141/60  Pulse: 69  Resp: 16  Temp: 98.9 F (37.2 C)  SpO2: 96%     Constitutional: Alert  Eyes: Conjunctivae are normal.  Head: Atraumatic. Nose: No congestion/rhinnorhea. Mouth/Throat: Mucous membranes are moist.   Neck: Painless ROM.  Cardiovascular:   Good peripheral circulation. Respiratory: Normal respiratory effort.  No retractions.  Gastrointestinal: Soft and nontender.  Musculoskeletal:  no deformity Neurologic:  MAE spontaneously. No gross focal neurologic deficits are appreciated.  Skin:  Skin is warm, dry and intact. No rash noted. Psychiatric: Mood and affect are normal. Speech and behavior are normal.    ED Results / Procedures /  Treatments   Labs (all labs ordered are listed, but only abnormal results are displayed) Labs Reviewed  COMPREHENSIVE METABOLIC PANEL - Abnormal; Notable for the following components:      Result Value   Sodium 133 (*)    Glucose, Bld 115 (*)    Calcium 8.8 (*)    All other components within normal limits  C DIFFICILE QUICK SCREEN W PCR REFLEX    GASTROINTESTINAL PANEL BY PCR, STOOL (REPLACES STOOL CULTURE)  LIPASE, BLOOD  CBC  OCCULT BLOOD X 1 CARD TO LAB, STOOL  URINALYSIS, ROUTINE W REFLEX MICROSCOPIC  POC OCCULT BLOOD, ED  TYPE AND SCREEN     EKG     RADIOLOGY    PROCEDURES:  Critical Care performed:   Procedures   MEDICATIONS ORDERED IN ED: Medications  iohexol (OMNIPAQUE) 300 MG/ML solution 100 mL (100 mLs Intravenous Contrast Given 06/25/23 1446)     IMPRESSION / MDM / ASSESSMENT AND PLAN / ED COURSE  I reviewed the triage vital signs and the nursing notes.  Differential diagnosis includes, but is not limited to, enteritis, gastritis, colitis, UGIB, LGIB  Patient presenting to the ER for evaluation of symptoms as described above.  Based on symptoms, risk factors and considered above differential, this presenting complaint could reflect a potentially life-threatening illness therefore the patient will be placed on continuous pulse oximetry and telemetry for monitoring.  Laboratory evaluation will be sent to evaluate for the above complaints.  Stool test and bedside occult blood test is negative.  It is dark but not melanotic no hematochezia.  Will send for PCR testing.   Clinical Course as of 06/25/23 1549  Sat Jun 25, 2023  1400 Guaiac test is negative.  Stool studies sent given diarrheal illness.  Given her abdominal pain will order CT imaging. [PR]  1513 C. difficile is negative. [PR]  1548 Patient reassessed.  She is well-appearing in no acute distress she is tolerating p.o.  CT imaging is reassuring.  Still awaiting  results of GI PCR but I do not feel the patient requires further observation in the ER at this point she does appear stable appropriate for outpatient follow-up.  Family agreeable to plan. [PR]    Clinical Course User Index [PR] Willy Eddy, MD     FINAL CLINICAL IMPRESSION(S) / ED DIAGNOSES   Final diagnoses:  Abdominal pain, unspecified abdominal location  Diarrhea, unspecified type     Rx / DC Orders   ED Discharge Orders          Ordered    ondansetron (ZOFRAN-ODT) 4 MG disintegrating tablet  Every 8 hours PRN        06/25/23 1547             Note:  This document was prepared using Dragon voice recognition software and may include unintentional dictation errors.    Willy Eddy, MD 06/25/23 [phone]

## 2023-06-25 NOTE — ED Triage Notes (Signed)
Pt to ED with family member for complaints of mid abdominal pain and black, diarrheal stools since Wednesday. Pt states has not had much appetite. 4-5 times per day diarrhea. Skin dry, respirations unlabored. Pt is ambulatory with steady gait.   Primary language is Amharic. Granddaughter interpreting per request and pt speaks moderate [place].

## 2023-06-25 NOTE — ED Notes (Signed)
Lab called and reported urine sent down spilt in bag and will need new sample

## 2023-06-25 NOTE — ED Provider Notes (Signed)
-----------------------------------------   4:39 PM on 06/25/2023 -----------------------------------------  This patient was discharged by Dr. Roxan Hockey, pending GI panel.  This has resulted prior to the patient actually leaving the ED and is positive both for norovirus and for several strains of E. coli.  I therefore have prescribed a course of azithromycin.  I counseled the patient and her family members on this additional result and the change in the plan of care; I reiterated strict return precautions and they expressed understanding.   Dionne Bucy, MD 06/25/23 [phone]

## 2023-06-25 NOTE — ED Triage Notes (Signed)
Patient states 4 days of black stools, nausea.  Sent to ED prior to triage.

## 2023-06-27 LAB — SURGICAL PATHOLOGY

## 2023-06-28 ENCOUNTER — Ambulatory Visit (HOSPITAL_COMMUNITY)
Admission: RE | Admit: 2023-06-28 | Discharge: 2023-06-28 | Disposition: A | Discharge status: Home / Self Care | Payer: Self-pay | Source: Ambulatory Visit

## 2023-06-28 DIAGNOSIS — E782 Mixed hyperlipidemia: Secondary | ICD-10-CM | POA: Insufficient documentation

## 2023-07-01 ENCOUNTER — Emergency Department: Payer: Medicare Other

## 2023-07-01 ENCOUNTER — Emergency Department
Admission: EM | Admit: 2023-07-01 | Discharge: 2023-07-01 | Disposition: A | Discharge status: Home / Self Care | Payer: Medicare Other | Attending: Emergency Medicine | Admitting: Emergency Medicine

## 2023-07-01 ENCOUNTER — Other Ambulatory Visit: Payer: Self-pay

## 2023-07-01 DIAGNOSIS — M79604 Pain in right leg: Secondary | ICD-10-CM

## 2023-07-01 DIAGNOSIS — M79651 Pain in right thigh: Secondary | ICD-10-CM | POA: Diagnosis not present

## 2023-07-01 DIAGNOSIS — M5441 Lumbago with sciatica, right side: Secondary | ICD-10-CM

## 2023-07-01 DIAGNOSIS — M5416 Radiculopathy, lumbar region: Secondary | ICD-10-CM | POA: Diagnosis not present

## 2023-07-01 MED ORDER — OXYCODONE-ACETAMINOPHEN 5-325 MG PO TABS
1.0000 | ORAL_TABLET | Freq: Once | ORAL | Status: AC
  Administered 2023-07-01: 1 via ORAL
  Filled 2023-07-01: qty 1

## 2023-07-01 MED ORDER — LIDOCAINE 5 % EX PTCH: 1.0000 | MEDICATED_PATCH | Freq: Once | CUTANEOUS | Status: DC

## 2023-07-01 MED ORDER — HYDROCODONE-ACETAMINOPHEN 5-325 MG PO TABS: 1.0000 | ORAL_TABLET | Freq: Four times a day (QID) | ORAL | 0 refills | Status: AC | PRN

## 2023-07-01 NOTE — ED Triage Notes (Signed)
Pt arrives via POV with CC of acute R thigh pain that started last night at 8pm. Site not swollen and not hot to touch at this time.

## 2023-07-01 NOTE — ED Provider Notes (Signed)
Emergency Medicine Provider Triage Evaluation Note  ERENDIRA CRABTREE , a 68 y.o. female  was evaluated in triage.  Pt complains of right thigh pain.  Review of Systems  Positive: Right anterior thigh pain Negative: Numbness, weakness  Physical Exam  BP (!) 173/67   Pulse 64   Resp 19   Ht 5\' 5"  (1.651 m)   Wt 76 kg   SpO2 100%   BMI 27.88 kg/m  Gen:   Awake, no distress but appears uncomfortable Resp:  Normal effort  MSK:   Moves extremities without difficulty, no bony deformity, full range of motion in the hip and knee on the right leg, compartments soft, tender over the right anterior thigh without overlying erythema or warmth, 2+ right DP pulse, no calf tenderness or calf swelling   Medical Decision Making  Medically screening exam initiated at 6:43 AM.  Appropriate orders placed.  Rashel M Eblen was informed that the remainder of the evaluation will be completed by another provider, this initial triage assessment does not replace that evaluation, and the importance of remaining in the ED until their evaluation is complete.  Patient here with right thigh pain without any inciting event.  No obvious signs of cellulitis.  No recent injury.  Strong pulse noted to the foot.  Compartments soft.  Will obtain Doppler to rule out DVT.  Will give pain medication.   Lindsey Hommel, Layla Maw, DO 07/01/23 [phone]

## 2023-07-01 NOTE — Discharge Instructions (Addendum)
You were seen in the emergency department today for evaluation of your thigh pain.  Your exam and testing was fortunately overall reassuring.  I suspect your pain may be related to nerve pain from your back sometimes called sciatica.  You can continue to take Tylenol and ibuprofen to help with your back pain.  If you have breakthrough pain, I sent a short course of narcotic medicine to your pharmacy.  Do not drive or operate machinery when taking this.  Please follow-up with your primary care doctor for further evaluation.  If your back pain is not improving, I have also included information for follow-up with a spine specialist.  Return to the ER for new or worsening symptoms including worsening pain, new numbness, tingling, focal weakness, bowel or bladder incontinence, or any other new or concerning symptoms.

## 2023-07-01 NOTE — ED Provider Notes (Signed)
[facility] Provider Note    Event Date/Time   First MD Initiated Contact with Patient 07/01/23 [phone]     (approximate)   History   Leg Pain   HPI  Courtney Ingram is a 68 year old female presenting to the emergency department for evaluation of right thigh pain.  Patient declines interpreter, daughter assists with history. Last night, patient had onset of a sharp pain in her right thigh. Took ibuprofen with limited benefit.   Does report some pain over her lower back as well, but reports her thigh is more bothersome.. No recent fevers.  No numbness, tingling, weakness.  No recent trauma.  No bowel or bladder incontinence or difficulty emptying bladder.   Seen in our ER about a week ago for nausea, vomiting, diarrhea and was found to have norovirus as well as multiple strains of E. coli for which she has been on azithromycin.  She had CT imaging performed at that time without acute abnormalities, specifically no musculoskeletal abnormalities noted.     Physical Exam   Triage Vital Signs: ED Triage Vitals  Encounter Vitals Group     BP 07/01/23 0605 (!) 173/67     Systolic BP Percentile --      Diastolic BP Percentile --      Pulse Rate 07/01/23 0604 64     Resp 07/01/23 0604 19     Temp --      Temp Source 07/01/23 0604 Oral     SpO2 07/01/23 0604 100 %     Weight 07/01/23 0605 167 lb 8.8 oz (76 kg)     Height 07/01/23 0605 5\' 5"  (1.651 m)     Head Circumference --      Peak Flow --      Pain Score 07/01/23 0604 9     Pain Loc --      Pain Education --      Exclude from Growth Chart --     Most recent vital signs: Vitals:   07/01/23 0604 07/01/23 0605  BP:  (!) 173/67  Pulse: 64   Resp: 19   SpO2: 100%      General: Awake, interactive  CV:  Regular rate, good peripheral perfusion.  Resp:  Unlabored respirations.  Abd:  Nondistended, soft, mild tenderness most notably in the right lower abdomen without rebound or guarding Back:   Mild  tenderness over the lower thoracic and lumbar spine without step-offs or point tenderness Neuro:  Symmetric facial movement, fluid speech MSK:  5-5 strength of the bilateral lower extremities.  Freely ranging without significant pain.  Next.  Tenderness to palpation over the right thigh.  No overlying erythema, warmth.  2+ DP pulses bilaterally.  Sensation intact and symmetric throughout the extremity  ED Results / Procedures / Treatments   Labs (all labs ordered are listed, but only abnormal results are displayed) Labs Reviewed - No data to display   EKG EKG independently reviewed interpreted by myself (ER attending) demonstrates:    RADIOLOGY Imaging independently reviewed and interpreted by myself demonstrates:  Ultrasound without evidence of DVT  PROCEDURES:  Critical Care performed: No  Procedures   MEDICATIONS ORDERED IN ED: Medications  oxyCODONE-acetaminophen (PERCOCET/ROXICET) 5-325 MG per tablet 1 tablet (1 tablet Oral Given 07/01/23 0725)     IMPRESSION / MDM / ASSESSMENT AND PLAN / ED COURSE  I reviewed the triage vital signs and the nursing notes.  Differential diagnosis includes, but is not limited to, DVT, no evidence  of cellulitis or arterial occlusion.  Clinical history and exam not consistent with acute fracture.  Consideration for musculoskeletal pathology including sciatica, no evidence of acute spinal cord pathology  Patient's presentation is most consistent with acute presentation with potential threat to life or bodily function.  68 year old female presenting with right thigh pain.  Vital stable on presentation.  Physical exam reassuring with normal strength, sensation, readily palpable pulses.  Patient able to freely range.  Ultrasound ordered from triage without evidence of DVT.  Patient feels much improved after receiving pain control and is comfortable with discharge home.  Instructed follow-up with primary care doctor and given information for small  with spine specialist if her back pain persists.  Strict return precautions provided.  Patient discharged in stable condition.     FINAL CLINICAL IMPRESSION(S) / ED DIAGNOSES   Final diagnoses:  Right leg pain  Acute low back pain with right-sided sciatica, unspecified back pain laterality     Rx / DC Orders   ED Discharge Orders          Ordered    HYDROcodone-acetaminophen (NORCO) 5-325 MG tablet  Every 6 hours PRN        07/01/23 0805             Note:  This document was prepared using Dragon voice recognition software and may include unintentional dictation errors.   Trinna Post, MD 07/01/23 [phone]

## 2023-07-01 NOTE — ED Notes (Signed)
Assumed patient care at approximately 0700 and received report from the previous nurse.

## 2023-07-05 DIAGNOSIS — C8590 Non-Hodgkin lymphoma, unspecified, unspecified site: Secondary | ICD-10-CM | POA: Diagnosis not present

## 2023-07-07 DIAGNOSIS — C8514 Unspecified B-cell lymphoma, lymph nodes of axilla and upper limb: Secondary | ICD-10-CM | POA: Diagnosis not present

## 2023-07-07 DIAGNOSIS — C8590 Non-Hodgkin lymphoma, unspecified, unspecified site: Secondary | ICD-10-CM | POA: Diagnosis not present

## 2023-07-07 DIAGNOSIS — M5416 Radiculopathy, lumbar region: Secondary | ICD-10-CM | POA: Diagnosis not present

## 2023-07-07 DIAGNOSIS — D479 Neoplasm of uncertain behavior of lymphoid, hematopoietic and related tissue, unspecified: Secondary | ICD-10-CM | POA: Diagnosis not present

## 2023-07-08 LAB — MISCELLANEOUS TEST

## 2023-07-11 NOTE — Progress Notes (Deleted)
 Referring Physician:  [facility] [address] [place],  [place] [address]  Primary Physician:  [facility]  History of Present Illness: 07/11/2023*** Courtney Ingram has a history of ***  Low back pain radiating down right thigh.    Duration: *** Location: *** Quality: *** Severity: ***  Precipitating: aggravated by *** Modifying factors: made better by *** Weakness: none Timing: *** Bowel/Bladder Dysfunction: none  Conservative measures:  Physical therapy: *** has not participated in PT Multimodal medical therapy including regular antiinflammatories: *** Methocarbamol , Meloxicam Injections: no epidural steroid injections  Past Surgery: ***none  Courtney Ingram has ***no symptoms of cervical myelopathy.  The symptoms are causing a significant impact on the patient's life.   Review of Systems:  A 10 point review of systems is negative, except for the pertinent positives and negatives detailed in the HPI.  Past Medical History: Past Medical History:  Diagnosis Date   Diabetes mellitus without complication [facility])     Past Surgical History: Past Surgical History:  Procedure Laterality Date   BREAST BIOPSY Left 06/21/2023   US  LT BREAST BX W LOC DEV 1ST LESION IMG BX SPEC US  GUIDE 06/21/2023 [facility] MAMMOGRAPHY    Allergies: Allergies as of 07/13/2023 - Review Complete 07/01/2023  Allergen Reaction Noted   Fluorescein Nausea And Vomiting 03/01/2011    Medications: Outpatient Encounter Medications as of 07/13/2023  Medication Sig   irbesartan (AVAPRO) 300 MG tablet 1 tablet   JANUMET XR 50-1000 MG TB24 Take 1 tablet by mouth 2 (two) times daily.   meloxicam (MOBIC) 15 MG tablet 1 tablet,for pain   methocarbamol  (ROBAXIN ) 500 MG tablet Take 1 tablet (500 mg total) by mouth 2 (two) times daily as needed for muscle spasms.   ondansetron  (ZOFRAN -ODT) 4 MG disintegrating tablet Take 1 tablet (4 mg total) by mouth every 8  (eight) hours as needed for nausea or vomiting.   Semaglutide,0.25 or 0.5MG /DOS, (OZEMPIC, 0.25 OR 0.5 MG/DOSE,) 2 MG/1.5ML SOPN 0.25 mg/wee   No facility-administered encounter medications on file as of 07/13/2023.    Social History: Social History   Tobacco Use   Smoking status: Never   Smokeless tobacco: Never  Substance Use Topics   Alcohol use: Never   Drug use: Never    Family Medical History: Family History  Family history unknown: Yes    Physical Examination: There were no vitals filed for this visit.  General: Patient is well developed, well nourished, calm, collected, and in no apparent distress. Attention to examination is appropriate.  Respiratory: Patient is breathing without any difficulty.   NEUROLOGICAL:     Awake, alert, oriented to person, place, and time.  Speech is clear and fluent. Fund of knowledge is appropriate.   Cranial Nerves: Pupils equal round and reactive to light.  Facial tone is symmetric.    *** ROM of cervical spine *** pain *** posterior cervical tenderness. *** tenderness in bilateral trapezial region.   *** ROM of lumbar spine *** pain *** posterior lumbar tenderness.   No abnormal lesions on exposed skin.   Strength: Side Biceps Triceps Deltoid Interossei Grip Wrist Ext. Wrist Flex.  R 5 5 5 5 5 5 5   L 5 5 5 5 5 5 5    Side Iliopsoas Quads Hamstring PF DF EHL  R 5 5 5 5 5 5   L 5 5 5 5 5 5    Reflexes are ***2+ and symmetric at the biceps, brachioradialis, patella and achilles.   Hoffman's is absent.  Clonus is not present.   Bilateral upper and lower extremity sensation is intact to light touch.     Gait is normal.   ***No difficulty with tandem gait.    Medical Decision Making  Imaging: ***  I have personally reviewed the images and agree with the above interpretation.  Assessment and Plan: [name] is a pleasant 68 y.o. female has ***  Treatment options discussed with patient and following plan made:   -  Order for physical therapy for *** spine ***. Patient to call to schedule appointment. *** - Continue current medications including ***. Reviewed dosing and side effects.  - Prescription for ***. Reviewed dosing and side effects. Take with food.  - Prescription for *** to take prn muscle spasms. Reviewed dosing and side effects. Discussed this can cause drowsiness.  - MRI of *** to further evaluate *** radiculopathy. No improvement time or medications (***).  - Referral to PMR at [facility] to discuss possible *** injections.  - Will schedule phone visit to review MRI results once I get them back.   I spent a total of *** minutes in face-to-face and non-face-to-face activities related to this patient's care today including review of outside records, review of imaging, review of symptoms, physical exam, discussion of differential diagnosis, discussion of treatment options, and documentation.   Thank you for involving me in the care of this patient.   Glade Boys PA-C Dept. of Neurosurgery

## 2023-07-12 ENCOUNTER — Ambulatory Visit: Payer: Medicare Other | Admitting: Orthopedic Surgery

## 2023-07-12 DIAGNOSIS — H4312 Vitreous hemorrhage, left eye: Secondary | ICD-10-CM | POA: Diagnosis not present

## 2023-07-12 DIAGNOSIS — E113513 Type 2 diabetes mellitus with proliferative diabetic retinopathy with macular edema, bilateral: Secondary | ICD-10-CM | POA: Diagnosis not present

## 2023-07-12 DIAGNOSIS — Z961 Presence of intraocular lens: Secondary | ICD-10-CM | POA: Diagnosis not present

## 2023-07-12 DIAGNOSIS — H35033 Hypertensive retinopathy, bilateral: Secondary | ICD-10-CM | POA: Diagnosis not present

## 2023-07-12 DIAGNOSIS — H3341 Traction detachment of retina, right eye: Secondary | ICD-10-CM | POA: Diagnosis not present

## 2023-07-12 DIAGNOSIS — H4053X0 Glaucoma secondary to other eye disorders, bilateral, stage unspecified: Secondary | ICD-10-CM | POA: Diagnosis not present

## 2023-07-13 ENCOUNTER — Ambulatory Visit: Payer: Medicare Other | Admitting: Orthopedic Surgery

## 2023-07-13 DIAGNOSIS — M5416 Radiculopathy, lumbar region: Secondary | ICD-10-CM | POA: Diagnosis not present

## 2023-07-15 DIAGNOSIS — M5416 Radiculopathy, lumbar region: Secondary | ICD-10-CM | POA: Diagnosis not present

## 2023-07-20 DIAGNOSIS — M5416 Radiculopathy, lumbar region: Secondary | ICD-10-CM | POA: Diagnosis not present

## 2023-07-25 DIAGNOSIS — M5416 Radiculopathy, lumbar region: Secondary | ICD-10-CM | POA: Diagnosis not present

## 2023-07-29 DIAGNOSIS — C8514 Unspecified B-cell lymphoma, lymph nodes of axilla and upper limb: Secondary | ICD-10-CM | POA: Diagnosis not present

## 2023-07-29 DIAGNOSIS — Z79899 Other long term (current) drug therapy: Secondary | ICD-10-CM | POA: Diagnosis not present

## 2023-08-03 DIAGNOSIS — M5416 Radiculopathy, lumbar region: Secondary | ICD-10-CM | POA: Diagnosis not present

## 2023-08-08 ENCOUNTER — Ambulatory Visit: Payer: Medicare Other | Admitting: Obstetrics and Gynecology

## 2023-08-08 DIAGNOSIS — D479 Neoplasm of uncertain behavior of lymphoid, hematopoietic and related tissue, unspecified: Secondary | ICD-10-CM | POA: Diagnosis not present

## 2023-08-08 DIAGNOSIS — C8514 Unspecified B-cell lymphoma, lymph nodes of axilla and upper limb: Secondary | ICD-10-CM | POA: Diagnosis not present

## 2023-08-08 DIAGNOSIS — C8590 Non-Hodgkin lymphoma, unspecified, unspecified site: Secondary | ICD-10-CM | POA: Diagnosis not present

## 2023-08-29 DIAGNOSIS — H6123 Impacted cerumen, bilateral: Secondary | ICD-10-CM | POA: Diagnosis not present

## 2023-08-29 DIAGNOSIS — H900 Conductive hearing loss, bilateral: Secondary | ICD-10-CM | POA: Diagnosis not present

## 2023-08-29 NOTE — Progress Notes (Signed)
 Otolaryngology Clinic Note  HPI:    Chief Complaint  Patient presents with  . Cerumen Impaction   Courtney Ingram is a pleasant 68 y.o. female who presents as a new patient for cerumen impaction. She feels her ears are plugged; has had them cleaned in the past. No use of Q-tips. She reports some hearing loss; no recent audiogram.  Denies otalgia, otorrhea, ear pruritus, tinnitus or dizziness.  No history of recurrent ear infections, ear surgeries or significant noise exposures.  Non-smoker.  Interpreter utilized.   PMH/Meds/All/SocHx/FamHx/ROS:   Past Medical History:  Diagnosis Date  . Benign essential hypertension 02/27/2021  . Cataract   . Diabetes mellitus (CMD)    Med Hx: Diabetes mellitus; LBS X 1 WEEK AGO CANT REMEMBER   AICHG 7.3  . Diabetes mellitus (CMD) 05/04/2011   last A1C around 9 in february 2018 (per daughter), FBG 125 on 09/10/16  . Diabetic retinopathy (CMD)   . DM mellitus, gestational   . Hordeolum externum of left eye 03/06/2015  . Hypertensive retinopathy of both eyes 02/11/2015  . Nuclear cataract of left eye 08/29/2014  . Nuclear sclerotic cataract of left eye 02/11/2015  . 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 Pseudophakia of left eye   . Pseudophakia of right eye 02/11/2015  . Retinal detachment   . Right lateral epicondylitis 01/04/2018  . Right retinal detachment 06/18/2011  . Secondary glaucoma 08/17/2011  . Status post intraocular lens implant 08/29/2014  . 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 Traction detachment of right retina 08/17/2011  . Traction retinal detachment 10/08/2011  . Vitreous hemorrhage (CMD) 08/29/2012    Past Surgical History:  Procedure Laterality Date  . CATARACT EXTRACTION W/  INTRAOCULAR LENS IMPLANT Left    Procedure: CATARACT EXTRACTION W/   INTRAOCULAR LENS IMPLANT; Dr. Edyth  . CATARACT EXTRACTION W/  INTRAOCULAR LENS IMPLANT Left 09/16/2016   Procedure: PHACOEMULSIFICATION PC / IOL TOPICAL;  Surgeon: Donnice Sickles, MD;  Location: [facility] MAIN OR;  Service: Ophthalmology;  Laterality: Left;  . COLONOSCOPY  2018   Procedure: COLONOSCOPY  . OTHER SURGICAL HISTORY  05/08/10   Procedure: OTHER SURGICAL HISTORY (RIGHT INTRAVITREAL INJECTION, AVASTIN)  . PARS PLANA VITRECTOMY  06/18/2011   Procedure: PARS PLANA VITRECTOMY  23GA;  Surgeon: Ismael Von Edyth, MD;  Location: [facility] OUTPATIENT OR;  Service: Ophthalmology;  Laterality: Right;  . RETINAL DETACHMENT SURGERY     Procedure: RETINAL DETACHMENT SURGERY  . VITRECTOMY  06/18/2011   Procedure: SILICONE OIL INSERTION;  Surgeon: Ismael Von Edyth, MD;  Location: [facility] OUTPATIENT OR;  Service: Ophthalmology;  Laterality: Right;    No family history of bleeding disorders, wound healing problems or difficulty with anesthesia.       Current Outpatient Medications:  .  acetaminophen  (TYLENOL ) 500 mg tablet, Take 500 mg by mouth as needed., Disp: , Rfl:  .  aspirin 81 mg EC tablet, 1 tablet, Disp: , Rfl:  .  atorvastatin (LIPITOR) 80 mg tablet, , Disp: , Rfl:  .  BD Ultra-Fine Micro Pen Needle 32 gauge x 1/4 ndle, USE 1 THREE TIMES DAILY 13, Disp: , Rfl:  .  brimonidine (ALPHAGAN) 0.2 % ophthalmic solution, Administer 1 drop into each eyes 2 (two) times a day., Disp: 15 mL, Rfl: 0 .  dapagliflozin propanediol (Farxiga) 10 mg tab tablet, , Disp: , Rfl:  .  FreeStyle Libre 14 Day Sensor kit, APPLY ONE SENSOR EVERY 14 DAYS FOR 84 DAYS, Disp: , Rfl:  .  glucose blood (Contour Test Strips) test strip, 1 strip by miscellaneous route Once Daily., Disp: 100 strip, Rfl: 3 .  glucose blood (Precision Q-I-D Test) test strip, 1 strip by miscellaneous route 4 (four) times a day after meals and nightly., Disp: 100 strip, Rfl: 1 .  insulin degludec Lelon FlexTouch U-100) 100 unit/mL (3 mL) pen, Inject  25 Units under the skin Once Daily., Disp: , Rfl:  .  irbesartan (AVAPRO) 300 mg tablet, TAKE 1 TABLET BY MOUTH EVERY DAY, Disp: 90 tablet, Rfl: 1 .  Janumet 50-1,000 mg per tablet, , Disp: , Rfl:  .  Jardiance 25 mg tab, Take 25 mg by mouth daily., Disp: , Rfl:  .  multivitamine, geriatric, Ship broker) tab, , Disp: , Rfl:  .  NovoLOG Flexpen U-100 Insulin 100 unit/mL (3 mL) pen, 12 TO 16 UNITS SUBCUTANEOUS THREE TIMES A DAY 30 DAYS, Disp: , Rfl:  .  polyethylene glycol (MIRALAX) 17 gram powd powder, 1 packet mixed with 8 ounces of fluid, Disp: , Rfl:  .  [place] FlexTouch U-200 200 unit/mL (3 mL) pen, 25 UNITS SUBCUTANEOUS ONCE A DAY 72 DAYS, Disp: , Rfl:   A complete ROS was performed with pertinent positives/negatives noted in the HPI. The remainder of the ROS are negative.    Physical Exam:    Temp 96.4 F (35.8 C) (Temporal)   Resp 16   Ht 1.575 m (5' 2)   Wt 73.6 kg (162 lb 3.2 oz)   BMI 29.67 kg/m    General Awake, at baseline alertness during examination.  Eyes No scleral icterus or conjunctival hemorrhage. Globe position appears normal. EOMI.   Right Ear Cerumen impaction.   Left Ear Cerumen impaction.   Nose Patent, no polyps or masses seen on anterior rhinoscopy.  Oral cavity No mucosal lesions or tumors seen. Tongue midline.   Oropharynx Symmetric tonsils.   Neck No abnormal cervical lymphadenopathy. No thyromegaly. No thyroid  masses palpated.  Cardio-vascular No cyanosis.  Pulmonary No audible stridor. Breathing easily with no labor.  Neuro Symmetric facial movement.   Psychiatry Appropriate affect and mood for clinic visit.   Independent Review of Additional Tests or Records:   Medical records.   Procedures:   Procedure Note - Removal of Cerumen Impaction, Bilateral:  Risks/benefits and alternatives were discussed with patient who understands and agrees to proceed.  DETAILS OF PROCEDURE: The patient was positioned and the ear was examined with the  microscope. Obstructing cerumen was gently removed using suction and small forceps from both ear canals. Tms fully visualized, normal and intact, middle ears aerated. No signs of infection or cholesteatoma.   Hearing normalized in both ears per patient.   The patient tolerated this well.  No complications.   Impression & Plans:  Courtney Ingram is a 68 y.o. female with bilateral cerumen impactions, cleared under microscopic guidance. Normal ear exam. Offered to schedule her for audiometric testing at any time; she will call back to schedule as her convenience. Follow up as needed with ENT.  Patient agrees with the plan.  Velia Pry, PA-C [facility]

## 2023-12-16 DIAGNOSIS — R300 Dysuria: Secondary | ICD-10-CM | POA: Diagnosis not present

## 2023-12-16 DIAGNOSIS — N3941 Urge incontinence: Secondary | ICD-10-CM | POA: Diagnosis not present

## 2023-12-19 NOTE — Telephone Encounter (Signed)
 Called pt lvm to have pt call and schedule appt   [place] location   Dx urge incontinence      Auto dialer

## 2023-12-20 DIAGNOSIS — E1165 Type 2 diabetes mellitus with hyperglycemia: Secondary | ICD-10-CM | POA: Diagnosis not present

## 2023-12-20 DIAGNOSIS — E1169 Type 2 diabetes mellitus with other specified complication: Secondary | ICD-10-CM | POA: Diagnosis not present

## 2023-12-20 DIAGNOSIS — E782 Mixed hyperlipidemia: Secondary | ICD-10-CM | POA: Diagnosis not present

## 2023-12-20 DIAGNOSIS — I10 Essential (primary) hypertension: Secondary | ICD-10-CM | POA: Diagnosis not present

## 2023-12-20 DIAGNOSIS — Z8639 Personal history of other endocrine, nutritional and metabolic disease: Secondary | ICD-10-CM | POA: Diagnosis not present

## 2023-12-21 DIAGNOSIS — E782 Mixed hyperlipidemia: Secondary | ICD-10-CM | POA: Diagnosis not present

## 2023-12-21 DIAGNOSIS — E1169 Type 2 diabetes mellitus with other specified complication: Secondary | ICD-10-CM | POA: Diagnosis not present

## 2023-12-29 ENCOUNTER — Emergency Department (HOSPITAL_BASED_OUTPATIENT_CLINIC_OR_DEPARTMENT_OTHER): Admitting: Radiology

## 2023-12-29 ENCOUNTER — Encounter (HOSPITAL_BASED_OUTPATIENT_CLINIC_OR_DEPARTMENT_OTHER): Payer: Self-pay

## 2023-12-29 ENCOUNTER — Emergency Department (HOSPITAL_BASED_OUTPATIENT_CLINIC_OR_DEPARTMENT_OTHER): Admission: EM | Admit: 2023-12-29 | Discharge: 2023-12-29 | Disposition: A | Discharge status: Home / Self Care

## 2023-12-29 ENCOUNTER — Other Ambulatory Visit: Payer: Self-pay

## 2023-12-29 DIAGNOSIS — R0789 Other chest pain: Secondary | ICD-10-CM | POA: Diagnosis not present

## 2023-12-29 DIAGNOSIS — R079 Chest pain, unspecified: Secondary | ICD-10-CM | POA: Diagnosis not present

## 2023-12-29 DIAGNOSIS — R0602 Shortness of breath: Secondary | ICD-10-CM | POA: Diagnosis not present

## 2023-12-29 LAB — BASIC METABOLIC PANEL WITH GFR
Anion gap: 17 — ABNORMAL HIGH (ref 5–15)
BUN: 24 mg/dL — ABNORMAL HIGH (ref 8–23)
CO2: 21 mmol/L — ABNORMAL LOW (ref 22–32)
Calcium: 10.1 mg/dL (ref 8.9–10.3)
Chloride: 97 mmol/L — ABNORMAL LOW (ref 98–111)
Creatinine, Ser: 0.58 mg/dL (ref 0.44–1.00)
GFR, Estimated: >60 mL/min (ref >60)
Glucose, Bld: 133 mg/dL — ABNORMAL HIGH (ref 70–99)
Potassium: 4.6 mmol/L (ref 3.5–5.1)
Sodium: 136 mmol/L (ref 135–145)

## 2023-12-29 LAB — CBC
HCT: 37.6 % (ref 36.0–46.0)
Hemoglobin: 12.2 g/dL (ref 12.0–15.0)
MCH: 29.1 pg (ref 26.0–34.0)
MCHC: 32.4 g/dL (ref 30.0–36.0)
MCV: 89.7 fL (ref 80.0–100.0)
Platelets: 295 K/uL (ref 150–400)
RBC: 4.19 MIL/uL (ref 3.87–5.11)
RDW: 15.5 % (ref 11.5–15.5)
WBC: 6.6 K/uL (ref 4.0–10.5)
nRBC: 0 % (ref 0.0–0.2)

## 2023-12-29 LAB — TROPONIN T, HIGH SENSITIVITY
Troponin T High Sensitivity: <15 ng/L (ref <19)
Troponin T High Sensitivity: <15 ng/L (ref <19)

## 2023-12-29 NOTE — ED Provider Notes (Signed)
 [place] EMERGENCY DEPARTMENT AT [facility] Provider Note   CSN: [id number] Arrival date & time: 12/29/23  1550     Patient presents with: Chest Pain   Courtney Ingram is a 68 y.o. female.   68 year old female presents for evaluation of chest pain.  States it happened earlier today was a pressure-like sensation nonradiating and denies any shortness of breath or lightheadedness associated with it.  Daughter states they were at urgent care and told to come to the ER for further workup.  Patient states she no longer has any chest pain.  Did recently have a calcium score done and was told to increase her statin and she is taking a daily baby aspirin.   Chest Pain Associated symptoms: no abdominal pain, no back pain, no cough, no fever, no palpitations, no shortness of breath and no vomiting        Prior to Admission medications   Medication Sig Start Date End Date Taking? Authorizing Provider  irbesartan (AVAPRO) 300 MG tablet 1 tablet 07/28/20   [provider]  JANUMET XR 50-1000 MG TB24 Take 1 tablet by mouth 2 (two) times daily. 06/11/20   [provider]  meloxicam (MOBIC) 15 MG tablet 1 tablet,for pain 07/28/20   [provider]  methocarbamol  (ROBAXIN ) 500 MG tablet Take 1 tablet (500 mg total) by mouth 2 (two) times daily as needed for muscle spasms. 08/29/20   Corlis Burnard DEL, NP  ondansetron  (ZOFRAN -ODT) 4 MG disintegrating tablet Take 1 tablet (4 mg total) by mouth every 8 (eight) hours as needed for nausea or vomiting. 06/25/23   Lang Dover, MD  Semaglutide,0.25 or 0.5MG /DOS, (OZEMPIC, 0.25 OR 0.5 MG/DOSE,) 2 MG/1.5ML SOPN 0.25 mg/wee 08/28/20   [provider]    Allergies: Fluorescein    Review of Systems  Constitutional:  Negative for chills and fever.  HENT:  Negative for ear pain and sore throat.   Eyes:  Negative for pain and visual disturbance.  Respiratory:  Negative for cough and shortness of breath.    Cardiovascular:  Positive for chest pain. Negative for palpitations.  Gastrointestinal:  Negative for abdominal pain and vomiting.  Genitourinary:  Negative for dysuria and hematuria.  Musculoskeletal:  Negative for arthralgias and back pain.  Skin:  Negative for color change and rash.  Neurological:  Negative for seizures and syncope.  All other systems reviewed and are negative.   Updated Vital Signs BP (!) 133/54 (BP Location: Left Arm)   Pulse 70   Temp 98.8 F (37.1 C) (Oral)   Resp (!) 23   Ht 5' 3 (1.6 m)   Wt 71.2 kg   SpO2 100%   BMI 27.81 kg/m   Physical Exam Vitals and nursing note reviewed.  Constitutional:      General: She is not in acute distress.    Appearance: She is well-developed.  HENT:     Head: Normocephalic and atraumatic.  Eyes:     Conjunctiva/sclera: Conjunctivae normal.  Cardiovascular:     Rate and Rhythm: Normal rate and regular rhythm.     Heart sounds: Normal heart sounds. No murmur heard. Pulmonary:     Effort: Pulmonary effort is normal. No tachypnea or respiratory distress.     Breath sounds: Normal breath sounds.  Abdominal:     Palpations: Abdomen is soft.     Tenderness: There is no abdominal tenderness.  Musculoskeletal:        General: No swelling.     Cervical back: Neck  supple.  Skin:    General: Skin is warm and dry.     Capillary Refill: Capillary refill takes less than 2 seconds.  Neurological:     General: No focal deficit present.     Mental Status: She is alert.  Psychiatric:        Mood and Affect: Mood normal.     (all labs ordered are listed, but only abnormal results are displayed) Labs Reviewed  BASIC METABOLIC PANEL WITH GFR - Abnormal; Notable for the following components:      Result Value   Chloride 97 (*)    CO2 21 (*)    Glucose, Bld 133 (*)    BUN 24 (*)    Anion gap 17 (*)    All other components within normal limits  CBC  TROPONIN T, HIGH SENSITIVITY  TROPONIN T, HIGH SENSITIVITY     EKG: EKG Interpretation Date/Time:  Thursday December 29 2023 16:10:36 EDT Ventricular Rate:  75 PR Interval:  204 QRS Duration:  88 QT Interval:  420 QTC Calculation: 469 R Axis:   75  Text Interpretation: Normal sinus rhythm Low voltage QRS Cannot rule out Anteroseptal infarct (cited on or before 19-Feb-2007) Compared with prior EKG from 02/19/2007 Confirmed by Gennaro Bouchard ([id number]) on 12/29/2023 4:28:13 PM  Radiology: [facility] Chest 2 View Result Date: 12/29/2023 CLINICAL DATA:  Chest pain. EXAM: CHEST - 2 VIEW COMPARISON:  Oct 21, 2008. FINDINGS: The heart size and mediastinal contours are within normal limits. Both lungs are clear. The visualized skeletal structures are unremarkable. IMPRESSION: No active cardiopulmonary disease. Electronically Signed   By: Lynwood Landy Raddle M.D.   On: 12/29/2023 17:40     Procedures   Medications Ordered in the ED - No data to display                                  Medical Decision Making Patient sent in by urgent care for chest pain.  She has a completely negative workup feeling fine had no recurrence of her pain while in the ER with normal vital signs.  I doubt cardiac etiology at this time.  Advised close follow-up with PCP and otherwise return to the ER for new or worsening symptoms.  Advised Tylenol  Motrin as needed for pain.  All results and plan discussed with patient and family at bedside.  Patient feels comfortable to plan to be discharged home.  Problems Addressed: Atypical chest pain: undiagnosed new problem with uncertain prognosis  Amount and/or Complexity of Data Reviewed External Data Reviewed: notes.    Details: Outpatient records reviewed and patient was sent here by urgent care today for further evaluation Labs: ordered. Decision-making details documented in ED Course.    Details: Labs ordered and reviewed by me and unremarkable, troponins are negative x 2 Radiology: ordered and independent interpretation performed.  Decision-making details documented in ED Course.    Details: Ordered and interpreted by me independently of radiology Chest x-ray shows no acute abnormality in the chest ECG/medicine tests: ordered and independent interpretation performed. Decision-making details documented in ED Course.    Details: Ordered and interpreted by me in the absence of cardiology and shows sinus rhythm, no STEMI and no acute abnormality or change when compared to prior  Risk OTC drugs. Prescription drug management.     Final diagnoses:  Atypical chest pain    ED Discharge Orders     None  Gennaro Duwaine CROME, DO 12/29/23 2141

## 2023-12-29 NOTE — ED Triage Notes (Signed)
 Pt reports chest pain yesterday while walking up steps. Pt endorses SOB and fatigue. Pt denies any N/V. Pt went to UC and sent to ED.

## 2023-12-29 NOTE — Progress Notes (Signed)
 Patient ID: Courtney Ingram is a 68 y.o. female.  Allergies[1]  Problem List[2]   Chief Complaint  Patient presents with  . Chest Pain    Pt c/o chest tightness, states she feels tired trying to walk up stairs which is not normal, and headache. Symptoms started yesterday. Pt has taken Tylenol       History of Present Illness: History of Present Illness This is a female with a history of plaque buildup in her heart presenting with chest discomfort.  History is reported by the patient's daughter in the presence of the patient. The patient experienced shortness of breath on 12/28/2023 while walking up the stairs and today, described a sensation of being squeezed or held on her chest. Her primary care physician had previously informed her of plaque buildup in her heart, as evidenced by a CT scan and a calcium score of >400. She was advised to start statin therapy and was referred to a cardiologist. The patient reports no nausea, vomiting, or stomach issues associated with the chest discomfort. She also reports no leg swelling or excessive sweating during the chest discomfort. She did experience a headache, which was managed with Tylenol . The patient describes the sensation as a tightness around her chest, rather than pain. She also reports feeling fatigued. She has not been in contact with any sick individuals recently. Her chest discomfort has improved compared to a few hours ago, but she still present. She did not report any pain and was reluctant to come to the clinic, preferring to rest instead. She does not feel as if something is sitting on her chest. She had a normal lunch and breakfast today. She experiences shortness of breath only during exertion, not at rest. She underwent blood tests with her primary care physician four days ago. She is diabetic.    Review of Systems: All others reviewed and negative except as listed above.  Objective: Vitals:   12/29/23 1502  BP: (!) 122/58   Pulse: 79  Resp: 18  Temp: 97.9 F (36.6 C)  TempSrc: Tympanic  SpO2: 98%  Weight: 71.2 kg (157 lb)     Physical Exam: Physical Exam Vitals and nursing note reviewed.  Constitutional:      General: She is not in acute distress.    Appearance: Normal appearance. She is not toxic-appearing.  HENT:     Head: Normocephalic and atraumatic.     Nose: Nose normal.     Mouth/Throat:     Mouth: Mucous membranes are moist.   Eyes:     Extraocular Movements: Extraocular movements intact.     Conjunctiva/sclera: Conjunctivae normal.    Cardiovascular:     Rate and Rhythm: Normal rate and regular rhythm.  Pulmonary:     Effort: Pulmonary effort is normal.     Breath sounds: Normal breath sounds. No wheezing, rhonchi or rales.  Abdominal:     General: Abdomen is flat.   Musculoskeletal:        General: Normal range of motion.     Cervical back: Normal range of motion.     Right lower leg: No edema.     Left lower leg: No edema.   Skin:    General: Skin is warm and dry.     Findings: No rash.   Neurological:     Mental Status: She is alert. Mental status is at baseline.   Psychiatric:        Mood and Affect: Mood normal.  Behavior: Behavior normal.    Sinus rhythm First degree A-V block  Low voltage QRS, consider pulmonary disease or obesity  Septal infarct  cited on or before 07-Oct-2020  T wave abnormality, consider inferior ischemia  When compared with ECG of 07-Oct-2020 18 09,  Nonspecific T wave abnormality, worse in anterolateral leads  QT has shortened   No results found for this visit on 12/29/23.   Assessment: Courtney Ingram was seen today for chest pain.  Diagnoses and all orders for this visit:  Chest pain, unspecified type -     ECG 12 lead  SOB (shortness of breath)     Assessment & Plan 1. Chest pain: Acute. SOB began yesterday with exertion and squeezing chest pain this AM. On arrival to UC VSS. Pt appears to be in NAD. EKG with nonspecific  T wave changes; unable to compare to EKG in 2022 through [facility] system. No others to compare to. History of high cholesterol, hypertension, and diabetes with recent elevated calcium score on CT.  - Referral to emergency department for troponin levels and chest x-ray     Plan: Follow up with ED    Symptomatic management discussed.  Patient was given verbal and written instructions on symptoms that necessitate return to the UC/ED, and instructed to f/u w/ UC or PCP if not improving in expected timeframe.   Patient/parent has been instructed on RX/OTC medications, dosages, side effects, and possible interactions as associated with each diagnosis in my impression and plan above.   Patient education (verbal/handout) given on diagnosis, pathophysiology, treatment of diagnosis, side effects of medication use for treatment, restrictions while taking medication, supportives measures such as staying hydrated.   Red Flags associated with diagnosis/es were reviewed and patient instructed on action plan if red flags develop.   They have been instructed that if symptoms worsen or red flags develop they should return to Urgent Care, go to the nearest ED, or activate EMS/911.     Patient and/or parent/guardian (if applicable) agreed with plan and voiced understanding.  No barriers to adherence perceived by myself.  During this patient encounter if the patient presented with respiratory complaints and any concern for possible COVID, the patient was wearing a mask. In these cases, throughout the encounter, I was wearing at least a surgical mask as well myself.     Portions of this note may have been dictated using Dragon dictation software/hardware and may contain grammatical or spelling errors.   Electronically signed by Morgan Browns  Thu 12/29/2023 3:35 PM         [1] Allergies Allergen Reactions  . Fluorescein GI Intolerance  . Ioversol   . Prednisone Other (See Comments)    Pt states she is  unsure of the reaction but still wants this on her allergy list  [2] Patient Active Problem List Diagnosis  . Diabetes mellitus (HCC)  . Proliferative vitreoretinopathy of left eye  . Secondary glaucoma  . Retinal detachment  . Status post intraocular lens implant  . Nuclear cataract of left eye  . 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 Hypertensive retinopathy of both eyes  . Pseudophakia of right eye  . Nuclear sclerotic cataract of left eye  . Hordeolum externum of left eye  . Benign essential hypertension  . Hypercholesterolemia  . Vitamin D deficiency  . Bilateral impacted cerumen  . Conductive hearing loss, bilateral

## 2023-12-29 NOTE — Discharge Instructions (Signed)
 You can use Tylenol  Motrin as needed for pain.  Call and follow-up with your primary care doctor.  Return to the ER for any new or worsening symptoms.

## 2023-12-31 DIAGNOSIS — I440 Atrioventricular block, first degree: Secondary | ICD-10-CM | POA: Diagnosis not present
